# Patient Record
Sex: Female | Born: 1980 | Race: White | Hispanic: No | Marital: Married | Smoking: Never smoker
Health system: Southern US, Community
[De-identification: ages and names within clinical notes are randomized; demographics above are authoritative.]

## PROBLEM LIST (undated history)

## (undated) ENCOUNTER — Inpatient Hospital Stay (HOSPITAL_COMMUNITY): Payer: Self-pay

## (undated) DIAGNOSIS — Z789 Other specified health status: Secondary | ICD-10-CM

## (undated) HISTORY — PX: TONSILLECTOMY: SUR1361

## (undated) HISTORY — PX: BREAST SURGERY: SHX581

---
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
## 1999-10-17 ENCOUNTER — Inpatient Hospital Stay (HOSPITAL_COMMUNITY): Admission: AD | Admit: 1999-10-17 | Discharge: 1999-10-17 | Payer: Self-pay | Admitting: Obstetrics

## 1999-11-13 ENCOUNTER — Other Ambulatory Visit: Admission: RE | Admit: 1999-11-13 | Discharge: 1999-11-13 | Payer: Self-pay | Admitting: Gynecology

## 2000-03-09 ENCOUNTER — Inpatient Hospital Stay (HOSPITAL_COMMUNITY): Admission: AD | Admit: 2000-03-09 | Discharge: 2000-03-09 | Payer: Self-pay | Admitting: Obstetrics and Gynecology

## 2000-04-08 ENCOUNTER — Inpatient Hospital Stay (HOSPITAL_COMMUNITY): Admission: AD | Admit: 2000-04-08 | Discharge: 2000-04-08 | Payer: Self-pay | Admitting: Obstetrics and Gynecology

## 2000-05-03 ENCOUNTER — Inpatient Hospital Stay (HOSPITAL_COMMUNITY): Admission: AD | Admit: 2000-05-03 | Discharge: 2000-05-03 | Payer: Self-pay | Admitting: Obstetrics and Gynecology

## 2000-05-27 ENCOUNTER — Inpatient Hospital Stay (HOSPITAL_COMMUNITY): Admission: AD | Admit: 2000-05-27 | Discharge: 2000-05-27 | Payer: Self-pay | Admitting: Obstetrics and Gynecology

## 2000-06-03 ENCOUNTER — Inpatient Hospital Stay (HOSPITAL_COMMUNITY): Admission: AD | Admit: 2000-06-03 | Discharge: 2000-06-05 | Payer: Self-pay | Admitting: Obstetrics and Gynecology

## 2000-08-09 ENCOUNTER — Emergency Department (HOSPITAL_COMMUNITY): Admission: EM | Admit: 2000-08-09 | Discharge: 2000-08-09 | Payer: Self-pay | Admitting: Emergency Medicine

## 2002-01-20 ENCOUNTER — Emergency Department (HOSPITAL_COMMUNITY): Admission: EM | Admit: 2002-01-20 | Discharge: 2002-01-20 | Payer: Self-pay | Admitting: *Deleted

## 2002-01-20 ENCOUNTER — Encounter: Payer: Self-pay | Admitting: *Deleted

## 2002-10-27 ENCOUNTER — Emergency Department (HOSPITAL_COMMUNITY): Admission: EM | Admit: 2002-10-27 | Discharge: 2002-10-27 | Payer: Self-pay | Admitting: Emergency Medicine

## 2003-06-17 ENCOUNTER — Other Ambulatory Visit: Admission: RE | Admit: 2003-06-17 | Discharge: 2003-06-17 | Payer: Self-pay | Admitting: Obstetrics and Gynecology

## 2004-06-12 ENCOUNTER — Other Ambulatory Visit: Admission: RE | Admit: 2004-06-12 | Discharge: 2004-06-12 | Payer: Self-pay | Admitting: Obstetrics and Gynecology

## 2004-10-30 ENCOUNTER — Inpatient Hospital Stay (HOSPITAL_COMMUNITY): Admission: AD | Admit: 2004-10-30 | Discharge: 2004-10-30 | Payer: Self-pay | Admitting: Obstetrics and Gynecology

## 2005-02-21 ENCOUNTER — Other Ambulatory Visit: Admission: RE | Admit: 2005-02-21 | Discharge: 2005-02-21 | Payer: Self-pay | Admitting: Obstetrics and Gynecology

## 2007-03-23 ENCOUNTER — Emergency Department: Payer: Self-pay | Admitting: Emergency Medicine

## 2015-02-17 LAB — OB RESULTS CONSOLE RPR: RPR: NONREACTIVE

## 2015-02-17 LAB — OB RESULTS CONSOLE ABO/RH: RH Type: POSITIVE

## 2015-02-17 LAB — OB RESULTS CONSOLE HIV ANTIBODY (ROUTINE TESTING): HIV: NONREACTIVE

## 2015-02-17 LAB — OB RESULTS CONSOLE HEPATITIS B SURFACE ANTIGEN: Hepatitis B Surface Ag: NEGATIVE

## 2015-02-17 LAB — OB RESULTS CONSOLE RUBELLA ANTIBODY, IGM: Rubella: IMMUNE

## 2015-02-17 LAB — OB RESULTS CONSOLE ANTIBODY SCREEN: ANTIBODY SCREEN: NEGATIVE

## 2015-02-22 LAB — OB RESULTS CONSOLE GC/CHLAMYDIA
CHLAMYDIA, DNA PROBE: NEGATIVE
GC PROBE AMP, GENITAL: NEGATIVE

## 2015-03-18 ENCOUNTER — Encounter (HOSPITAL_COMMUNITY): Payer: Self-pay | Admitting: *Deleted

## 2015-03-18 ENCOUNTER — Inpatient Hospital Stay (HOSPITAL_COMMUNITY)
Admission: AD | Admit: 2015-03-18 | Discharge: 2015-03-18 | Disposition: A | Discharge status: Home / Self Care | Payer: Medicaid Other | Source: Ambulatory Visit | Attending: Obstetrics and Gynecology | Admitting: Obstetrics and Gynecology

## 2015-03-18 DIAGNOSIS — Z3A13 13 weeks gestation of pregnancy: Secondary | ICD-10-CM | POA: Insufficient documentation

## 2015-03-18 DIAGNOSIS — O26852 Spotting complicating pregnancy, second trimester: Secondary | ICD-10-CM

## 2015-03-18 DIAGNOSIS — O209 Hemorrhage in early pregnancy, unspecified: Secondary | ICD-10-CM | POA: Insufficient documentation

## 2015-03-18 HISTORY — DX: Other specified health status: Z78.9

## 2015-03-18 LAB — URINALYSIS, ROUTINE W REFLEX MICROSCOPIC
Bilirubin Urine: NEGATIVE
Glucose, UA: NEGATIVE mg/dL
Hgb urine dipstick: NEGATIVE
Ketones, ur: NEGATIVE mg/dL
Leukocytes, UA: NEGATIVE
Nitrite: NEGATIVE
Protein, ur: NEGATIVE mg/dL
UROBILINOGEN UA: 0.2 mg/dL (ref 0.0–1.0)
pH: 5 (ref 5.0–8.0)

## 2015-03-18 LAB — BASIC METABOLIC PANEL
Anion gap: 6 (ref 5–15)
BUN: 9 mg/dL (ref 6–20)
CHLORIDE: 105 mmol/L (ref 101–111)
CO2: 24 mmol/L (ref 22–32)
Calcium: 8.9 mg/dL (ref 8.9–10.3)
Creatinine, Ser: 0.52 mg/dL (ref 0.44–1.00)
GFR calc Af Amer: >60 mL/min (ref >60)
GFR calc non Af Amer: >60 mL/min (ref >60)
GLUCOSE: 80 mg/dL (ref 65–99)
POTASSIUM: 4 mmol/L (ref 3.5–5.1)
SODIUM: 135 mmol/L (ref 135–145)

## 2015-03-18 LAB — CBC
HEMATOCRIT: 34.7 % — AB (ref 36.0–46.0)
Hemoglobin: 11.5 g/dL — ABNORMAL LOW (ref 12.0–15.0)
MCH: 28.8 pg (ref 26.0–34.0)
MCHC: 33.1 g/dL (ref 30.0–36.0)
MCV: 86.8 fL (ref 78.0–100.0)
Platelets: 188 10*3/uL (ref 150–400)
RBC: 4 MIL/uL (ref 3.87–5.11)
RDW: 14.2 % (ref 11.5–15.5)
WBC: 9.2 10*3/uL (ref 4.0–10.5)

## 2015-03-18 NOTE — MAU Note (Signed)
Vaginal bleeding this am about the size of half dollar. States has not felt well past 24 hours. Cramping to lower abd., Nausea and vomiting thisa m

## 2015-03-18 NOTE — MAU Provider Note (Signed)
History     CSN: [id number]  Arrival date and time: 03/18/15 1334   First Provider Initiated Contact with Patient 03/18/15 1436      Chief Complaint  Patient presents with  . Vaginal Bleeding   HPI Comments: Sierra Bridges is a 34 y.o. [id number] at [redacted]w[redacted]d presented with onset this morning of first episode of vaginal spotting. She reports quarter size of brown pink blood noted several hours ago. No antecedent intercourse. No irritative vaginitis and has had negative cultures at her NOB visit recently. She also reports suprapubic cramping intermittently for a couple of days as well as pronounced fatigue and weakness for 2 days. Denies orthostatic symptoms at present.   Vaginal Bleeding The patient's primary symptoms include vaginal bleeding. The patient's pertinent negatives include no genital itching, genital lesions, genital odor or vaginal discharge. This is a new problem. The current episode started today. The problem has been unchanged. The pain is mild. The problem affects both sides. She is pregnant. Associated symptoms include abdominal pain and vomiting. Pertinent negatives include no back pain, chills, diarrhea, dysuria, fever, flank pain, frequency, hematuria, nausea or urgency. Associated symptoms comments: Pressures sensation with cramping that waxes and wanes. Single episode of vomiting this morning. Has retained food and fluids since.. The vaginal discharge was normal. The vaginal bleeding is spotting. She has not been passing clots. She has not been passing tissue. She has tried nothing for the symptoms. She is sexually active. No, her partner does not have an STD.  blood type: A pos  OB History  Gravida Para Term Preterm AB SAB TAB Ectopic Multiple Living  4 3 2 1      3     # Outcome Date GA Lbr Len/2nd Weight Sex Delivery Anes PTL Lv  4 Current           3 Preterm           2 Term           1 Term                Past Medical History  Diagnosis Date  . Medical  history non-contributory     Past Surgical History  Procedure Laterality Date  . Breast surgery    . Tonsillectomy      History reviewed. No pertinent family history.  Social History  Substance Use Topics  . Smoking status: Never Smoker   . Smokeless tobacco: None  . Alcohol Use: No    Allergies:  Allergies  Allergen Reactions  . Penicillins Rash    Has patient had a PCN reaction causing immediate rash, facial/tongue/throat swelling, SOB or lightheadedness with hypotension: Yes Has patient had a PCN reaction causing severe rash involving mucus membranes or skin necrosis: No Has patient had a PCN reaction that required hospitalization No Has patient had a PCN reaction occurring within the last 10 years: Yes If all of the above answers are "NO", then may proceed with Cephalosporin use.     Prescriptions prior to admission  Medication Sig Dispense Refill Last Dose  . Cholecalciferol (VITAMIN D3) 10000 UNITS capsule Take 10,000 Units by mouth once a week. mondays   Past Week at Unknown time  . Doxylamine-Pyridoxine (DICLEGIS) 10-10 MG TBEC Take 2 tablets by mouth at bedtime.   03/17/2015 at Unknown time  . omega-3 acid ethyl esters (LOVAZA) 1 G capsule Take 1 g by mouth daily.   03/18/2015 at Unknown time  . OVER THE COUNTER MEDICATION Take 1  capsule by mouth 2 (two) times daily as needed (for gi tract). Zypan, gi tract,   Past Week at Unknown time  . Prenatal Vit-Fe Fumarate-FA (PRENATAL MULTIVITAMIN) TABS tablet Take 1 tablet by mouth.   03/18/2015 at Unknown time    Review of Systems  Constitutional: Negative for fever and chills.  Gastrointestinal: Positive for vomiting and abdominal pain. Negative for nausea and diarrhea.  Genitourinary: Positive for vaginal bleeding. Negative for dysuria, urgency, frequency, hematuria, flank pain and vaginal discharge.  Musculoskeletal: Negative for back pain.   Physical Exam   Blood pressure 124/85, pulse 84, temperature 98.1 F (36.7 C),  temperature source Oral, resp. rate 20.  Physical Exam  Nursing note and vitals reviewed. Constitutional: She is oriented to person, place, and time. She appears well-developed and well-nourished.  HENT:  Head: Normocephalic.  Eyes: Pupils are equal, round, and reactive to light.  Neck: Normal range of motion.  Cardiovascular: Normal rate and regular rhythm.   Respiratory: Effort normal.  GI: Soft. There is no tenderness.  Genitourinary:  NEFG; vagina with scant physiologic discharge; cx clean, L/C/H,uterus 14-16wk size. FHR 146  Musculoskeletal: Normal range of motion.  Neurological: She is alert and oriented to person, place, and time.  Skin: Skin is warm and dry.  Psychiatric: She has a normal mood and affect.  anxious    MAU Course  Procedures Results for orders placed or performed during the hospital encounter of 03/18/15 (from the past 24 hour(s))  Urinalysis, Routine w reflex microscopic (not at [facility])     Status: Abnormal   Collection Time: 03/18/15  1:37 PM  Result Value Ref Range   Color, Urine YELLOW YELLOW   APPearance CLEAR CLEAR   Specific Gravity, Urine <1.005 (L) 1.005 - 1.030   pH 5.0 5.0 - 8.0   Glucose, UA NEGATIVE NEGATIVE mg/dL   Hgb urine dipstick NEGATIVE NEGATIVE   Bilirubin Urine NEGATIVE NEGATIVE   Ketones, ur NEGATIVE NEGATIVE mg/dL   Protein, ur NEGATIVE NEGATIVE mg/dL   Urobilinogen, UA 0.2 0.0 - 1.0 mg/dL   Nitrite NEGATIVE NEGATIVE   Leukocytes, UA NEGATIVE NEGATIVE  Basic metabolic panel     Status: None   Collection Time: 03/18/15  3:00 PM  Result Value Ref Range   Sodium 135 135 - 145 mmol/L   Potassium 4.0 3.5 - 5.1 mmol/L   Chloride 105 101 - 111 mmol/L   CO2 24 22 - 32 mmol/L   Glucose, Bld 80 65 - 99 mg/dL   BUN 9 6 - 20 mg/dL   Creatinine, Ser 6.96 0.44 - 1.00 mg/dL   Calcium 8.9 8.9 - 29.5 mg/dL   GFR calc non Af Amer >60 >60 mL/min   GFR calc Af Amer >60 >60 mL/min   Anion gap 6 5 - 15  CBC     Status: Abnormal   Collection  Time: 03/18/15  3:00 PM  Result Value Ref Range   WBC 9.2 4.0 - 10.5 K/uL   RBC 4.00 3.87 - 5.11 MIL/uL   Hemoglobin 11.5 (L) 12.0 - 15.0 g/dL   HCT 28.4 (L) 13.2 - 44.0 %   MCV 86.8 78.0 - 100.0 fL   MCH 28.8 26.0 - 34.0 pg   MCHC 33.1 30.0 - 36.0 g/dL   RDW 10.2 72.5 - 36.6 %   Platelets 188 150 - 400 K/uL   MDM No ative VB or cervicitis. Fatigue without evident hypoglycemia or anemia C/W Dr. Rana Snare  Assessment and Plan   1. Spotting  complicating pregnancy, second trimester    Discharge home with reassurance. Advised to eat small frequent meals, no sudden position changes, increase rest, increase fluids   Medication List    ASK your doctor about these medications        DICLEGIS 10-10 MG Tbec  Generic drug:  Doxylamine-Pyridoxine  Take 2 tablets by mouth at bedtime.     omega-3 acid ethyl esters 1 G capsule  Commonly known as:  LOVAZA  Take 1 g by mouth daily.     OVER THE COUNTER MEDICATION  Take 1 capsule by mouth 2 (two) times daily as needed (for gi tract). Zypan, gi tract,     prenatal multivitamin Tabs tablet  Take 1 tablet by mouth.     Vitamin D3 10000 UNITS capsule  Take 10,000 Units by mouth once a week. mondays       Follow-up Information    Follow up with LOWE,DAVID C, MD On 03/22/2015.   Specialty:  Obstetrics and Gynecology   Why:  Keep your scheduled prenatal appointment   Contact information:   [address] [place], [address] [place] [address] [phone]      Valeria Krisko 03/18/2015, 2:50 PM

## 2015-03-18 NOTE — Discharge Instructions (Signed)
Vaginal Bleeding During Pregnancy, Second Trimester °A small amount of bleeding (spotting) from the vagina is relatively common in pregnancy. It usually stops on its own. Various things can cause bleeding or spotting in pregnancy. Some bleeding may be related to the pregnancy, and some may not. Sometimes the bleeding is normal and is not a problem. However, bleeding can also be a sign of something serious. Be sure to tell your health care provider about any vaginal bleeding right away. °Some possible causes of vaginal bleeding during the second trimester include: °· Infection, inflammation, or growths on the cervix.   °· The placenta may be partially or completely covering the opening of the cervix inside the uterus (placenta previa). °· The placenta may have separated from the uterus (abruption of the placenta).   °· You may be having early (preterm) labor.   °· The cervix may not be strong enough to keep a baby inside the uterus (cervical insufficiency).   °· Tiny cysts may have developed in the uterus instead of pregnancy tissue (molar pregnancy).  °HOME CARE INSTRUCTIONS  °Watch your condition for any changes. The following actions may help to lessen any discomfort you are feeling: °· Follow your health care provider's instructions for limiting your activity. If your health care provider orders bed rest, you may need to stay in bed and only get up to use the bathroom. However, your health care provider may allow you to continue light activity. °· If needed, make plans for someone to help with your regular activities and responsibilities while you are on bed rest. °· Keep track of the number of pads you use each day, how often you change pads, and how soaked (saturated) they are. Write this down. °· Do not use tampons. Do not douche. °· Do not have sexual intercourse or orgasms until approved by your health care provider. °· If you pass any tissue from your vagina, save the tissue so you can show it to your  health care provider. °· Only take over-the-counter or prescription medicines as directed by your health care provider. °· Do not take aspirin because it can make you bleed. °· Do not exercise or perform any strenuous activities or heavy lifting without your health care provider's permission. °· Keep all follow-up appointments as directed by your health care provider. °SEEK MEDICAL CARE IF: °· You have any vaginal bleeding during any part of your pregnancy. °· You have cramps or labor pains. °· You have a fever, not controlled by medicine. °SEEK IMMEDIATE MEDICAL CARE IF:  °· You have severe cramps in your back or belly (abdomen). °· You have contractions. °· You have chills. °· You pass large clots or tissue from your vagina. °· Your bleeding increases. °· You feel light-headed or weak, or you have fainting episodes. °· You are leaking fluid or have a gush of fluid from your vagina. °MAKE SURE YOU: °· Understand these instructions. °· Will watch your condition. °· Will get help right away if you are not doing well or get worse. °Document Released: 04/04/2005 Document Revised: 06/30/2013 Document Reviewed: 03/02/2013 °Exit[employer]® Patient Information ©2015 [employer]. This information is not intended to replace advice given to you by your health care provider. Make sure you discuss any questions you have with your health care provider. ° °

## 2015-03-22 ENCOUNTER — Encounter (HOSPITAL_COMMUNITY): Payer: Self-pay

## 2015-07-10 NOTE — L&D Delivery Note (Signed)
Delivery Note At 10:19 PM a viable female was delivered via Vaginal, Spontaneous Delivery Apgars 9 9 weight pending   Placenta status: spontaneously with 3 vessel cord, .  Cord:  with the following complications:long .  Cord pH: not obtained  Anesthesia:  epidural Episiotomy:  none Lacerations: none  Suture Repair: not applicable Est. Blood Loss (mL):  300  Mom to postpartum.  Baby to Couplet care / Skin to Skin.  Antonetta Clanton L 08/31/2015, 10:28 PM

## 2015-08-15 ENCOUNTER — Inpatient Hospital Stay (HOSPITAL_COMMUNITY)
Admission: AD | Admit: 2015-08-15 | Discharge: 2015-08-16 | Disposition: A | Discharge status: Home / Self Care | Payer: Medicaid Other | Source: Ambulatory Visit | Attending: Obstetrics & Gynecology | Admitting: Obstetrics & Gynecology

## 2015-08-15 ENCOUNTER — Encounter (HOSPITAL_COMMUNITY): Payer: Self-pay

## 2015-08-15 DIAGNOSIS — Z3A36 36 weeks gestation of pregnancy: Secondary | ICD-10-CM | POA: Diagnosis not present

## 2015-08-15 LAB — URINALYSIS, ROUTINE W REFLEX MICROSCOPIC
BILIRUBIN URINE: NEGATIVE
Glucose, UA: NEGATIVE mg/dL
Hgb urine dipstick: NEGATIVE
Ketones, ur: NEGATIVE mg/dL
Leukocytes, UA: NEGATIVE
NITRITE: NEGATIVE
Protein, ur: NEGATIVE mg/dL
pH: 6.5 (ref 5.0–8.0)

## 2015-08-15 NOTE — MAU Note (Signed)
Pt c/o contractions that are every 5 mins that started tonight. Has felt nauseous all day and like she was about to start period. Denies vag bleeding or LOF. +FM. Baby is breech. Cervix was 1.5 last week in office.

## 2015-08-15 NOTE — MAU Note (Signed)
Called Dr. Langston Masker and she offered pt a procardia for the contractions.  She decided against it. Will recheck cervix in one hour.

## 2015-08-16 NOTE — Discharge Instructions (Signed)
Hypertension During Pregnancy °Hypertension, or high blood pressure, is when there is extra pressure inside your blood vessels that carry blood from the heart to the rest of your body (arteries). It can happen at any time in life, including pregnancy. Hypertension during pregnancy can cause problems for you and your baby. Your baby might not weigh as much as he or she should at birth or might be born early (premature). Very bad cases of hypertension during pregnancy can be life-threatening.  °Different types of hypertension can occur during pregnancy. These include: °· Chronic hypertension. This happens when a woman has hypertension before pregnancy and it continues during pregnancy. °· Gestational hypertension. This is when hypertension develops during pregnancy. °· Preeclampsia or toxemia of pregnancy. This is a very serious type of hypertension that develops only during pregnancy. It affects the whole body and can be very dangerous for both mother and baby.   °Gestational hypertension and preeclampsia usually go away after your baby is born. Your blood pressure will likely stabilize within 6 weeks. Women who have hypertension during pregnancy have a greater chance of developing hypertension later in life or with future pregnancies. °RISK FACTORS °There are certain factors that make it more likely for you to develop hypertension during pregnancy. These include: °· Having hypertension before pregnancy. °· Having hypertension during a previous pregnancy. °· Being overweight. °· Being older than 40 years. °· Being pregnant with more than one baby. °· Having diabetes or kidney problems. °SIGNS AND SYMPTOMS °Chronic and gestational hypertension rarely cause symptoms. Preeclampsia has symptoms, which may include: °· Increased protein in your urine. Your health care provider will check for this at every prenatal visit. °· Swelling of your hands and face. °· Rapid weight gain. °· Headaches. °· Visual changes. °· Being  bothered by light. °· Abdominal pain, especially in the upper right area. °· Chest pain. °· Shortness of breath. °· Increased reflexes. °· Seizures. These occur with a more severe form of preeclampsia, called eclampsia. °DIAGNOSIS  °You may be diagnosed with hypertension during a regular prenatal exam. At each prenatal visit, you may have: °· Your blood pressure checked. °· A urine test to check for protein in your urine. °The type of hypertension you are diagnosed with depends on when you developed it. It also depends on your specific blood pressure reading. °· Developing hypertension before 20 weeks of pregnancy is consistent with chronic hypertension. °· Developing hypertension after 20 weeks of pregnancy is consistent with gestational hypertension. °· Hypertension with increased urinary protein is diagnosed as preeclampsia. °· Blood pressure measurements that stay above 160 systolic or 110 diastolic are a sign of severe preeclampsia. °TREATMENT °Treatment for hypertension during pregnancy varies. Treatment depends on the type of hypertension and how serious it is. °· If you take medicine for chronic hypertension, you may need to switch medicines. °¨ Medicines called ACE inhibitors should not be taken during pregnancy. °¨ Low-dose aspirin may be suggested for women who have risk factors for preeclampsia. °· If you have gestational hypertension, you may need to take a blood pressure medicine that is safe during pregnancy. Your health care provider will recommend the correct medicine. °· If you have severe preeclampsia, you may need to be in the hospital. Health care providers will watch you and your baby very closely. You also may need to take medicine called magnesium sulfate to prevent seizures and lower blood pressure. °· Sometimes, an early delivery is needed. This may be the case if the condition worsens. It would be   done to protect you and your baby. The only cure for preeclampsia is delivery. °· Your health  care provider may recommend that you take one low-dose aspirin (81 mg) each day to help prevent high blood pressure during your pregnancy if you are at risk for preeclampsia. You may be at risk for preeclampsia if: °¨ You had preeclampsia or eclampsia during a previous pregnancy. °¨ Your baby did not grow as expected during a previous pregnancy. °¨ You experienced preterm birth with a previous pregnancy. °¨ You experienced a separation of the placenta from the uterus (placental abruption) during a previous pregnancy. °¨ You experienced the loss of your baby during a previous pregnancy. °¨ You are pregnant with more than one baby. °¨ You have other medical conditions, such as diabetes or an autoimmune disease. °HOME CARE INSTRUCTIONS °· Schedule and keep all of your regular prenatal care appointments. This is important. °· Take medicines only as directed by your health care provider. Tell your health care provider about all medicines you take. °· Eat as little salt as possible. °· Get regular exercise. °· Do not drink alcohol. °· Do not use tobacco products. °· Do not drink products with caffeine. °· Lie on your left side when resting. °SEEK IMMEDIATE MEDICAL CARE IF: °· You have severe abdominal pain. °· You have sudden swelling in your hands, ankles, or face. °· You gain 4 pounds (1.8 kg) or more in 1 week. °· You vomit repeatedly. °· You have vaginal bleeding. °· You do not feel your baby moving as much. °· You have a headache. °· You have blurred or double vision. °· You have muscle twitching or spasms. °· You have shortness of breath. °· You have blue fingernails or lips. °· You have blood in your urine. °MAKE SURE YOU: °· Understand these instructions. °· Will watch your condition. °· Will get help right away if you are not doing well or get worse. °  °This information is not intended to replace advice given to you by your health care provider. Make sure you discuss any questions you have with your health care  provider. °  °Document Released: 03/13/2011 Document Revised: 07/16/2014 Document Reviewed: 01/22/2013 °Elsevier Interactive Patient Education ©2016 [employer] °Braxton Hicks Contractions °Contractions of the uterus can occur throughout pregnancy. Contractions are not always a sign that you are in labor.  °WHAT ARE BRAXTON HICKS CONTRACTIONS?  °Contractions that occur before labor are called Braxton Hicks contractions, or false labor. Toward the end of pregnancy (32-34 weeks), these contractions can develop more often and may become more forceful. This is not true labor because these contractions do not result in opening (dilatation) and thinning of the cervix. They are sometimes difficult to tell apart from true labor because these contractions can be forceful and people have different pain tolerances. You should not feel embarrassed if you go to the hospital with false labor. Sometimes, the only way to tell if you are in true labor is for your health care provider to look for changes in the cervix. °If there are no prenatal problems or other health problems associated with the pregnancy, it is completely safe to be sent home with false labor and await the onset of true labor. °HOW CAN YOU TELL THE DIFFERENCE BETWEEN TRUE AND FALSE LABOR? °False Labor °· The contractions of false labor are usually shorter and not as hard as those of true labor.   °· The contractions are usually irregular.   °· The contractions are often felt in   the front of the lower abdomen and in the groin.   °· The contractions may go away when you walk around or change positions while lying down.   °· The contractions get weaker and are shorter lasting as time goes on.   °· The contractions do not usually become progressively stronger, regular, and closer together as with true labor.   °True Labor °· Contractions in true labor last 30-70 seconds, become very regular, usually become more intense, and increase in frequency.   °· The  contractions do not go away with walking.   °· The discomfort is usually felt in the top of the uterus and spreads to the lower abdomen and low back.   °· True labor can be determined by your health care provider with an exam. This will show that the cervix is dilating and getting thinner.   °WHAT TO REMEMBER °· Keep up with your usual exercises and follow other instructions given by your health care provider.   °· Take medicines as directed by your health care provider.   °· Keep your regular prenatal appointments.   °· Eat and drink lightly if you think you are going into labor.   °· If Braxton Hicks contractions are making you uncomfortable:   °¨ Change your position from lying down or resting to walking, or from walking to resting.   °¨ Sit and rest in a tub of warm water.   °¨ Drink 2-3 glasses of water. Dehydration may cause these contractions.   °¨ Do slow and deep breathing several times an hour.   °WHEN SHOULD I SEEK IMMEDIATE MEDICAL CARE? °Seek immediate medical care if: °· Your contractions become stronger, more regular, and closer together.   °· You have fluid leaking or gushing from your vagina.   °· You have a fever.   °· You pass blood-tinged mucus.   °· You have vaginal bleeding.   °· You have continuous abdominal pain.   °· You have low back pain that you never had before.   °· You feel your baby's head pushing down and causing pelvic pressure.   °· Your baby is not moving as much as it used to.   °  °This information is not intended to replace advice given to you by your health care provider. Make sure you discuss any questions you have with your health care provider. °  °Document Released: 06/25/2005 Document Revised: 06/30/2013 Document Reviewed: 04/06/2013 °Elsevier Interactive Patient Education ©2016 [employer] ° °

## 2015-08-17 LAB — OB RESULTS CONSOLE GBS: GBS: NEGATIVE

## 2015-08-29 ENCOUNTER — Encounter (HOSPITAL_COMMUNITY): Payer: Self-pay | Admitting: *Deleted

## 2015-08-29 ENCOUNTER — Inpatient Hospital Stay (HOSPITAL_COMMUNITY)
Admission: AD | Admit: 2015-08-29 | Discharge: 2015-08-30 | DRG: 778 | Disposition: A | Discharge status: Home / Self Care | Payer: Medicaid Other | Source: Ambulatory Visit | Attending: Obstetrics and Gynecology | Admitting: Obstetrics and Gynecology

## 2015-08-29 DIAGNOSIS — O26893 Other specified pregnancy related conditions, third trimester: Secondary | ICD-10-CM | POA: Diagnosis present

## 2015-08-29 DIAGNOSIS — Z3A37 37 weeks gestation of pregnancy: Secondary | ICD-10-CM

## 2015-08-29 DIAGNOSIS — R030 Elevated blood-pressure reading, without diagnosis of hypertension: Secondary | ICD-10-CM | POA: Diagnosis present

## 2015-08-29 DIAGNOSIS — IMO0001 Reserved for inherently not codable concepts without codable children: Secondary | ICD-10-CM

## 2015-08-29 LAB — CBC WITH DIFFERENTIAL/PLATELET
BASOS PCT: 0 %
Basophils Absolute: 0 10*3/uL (ref 0.0–0.1)
EOS PCT: 1 %
Eosinophils Absolute: 0.1 10*3/uL (ref 0.0–0.7)
HEMATOCRIT: 32 % — AB (ref 36.0–46.0)
Hemoglobin: 10.7 g/dL — ABNORMAL LOW (ref 12.0–15.0)
Lymphocytes Relative: 18 %
Lymphs Abs: 2.3 10*3/uL (ref 0.7–4.0)
MCH: 29.2 pg (ref 26.0–34.0)
MCHC: 33.4 g/dL (ref 30.0–36.0)
MCV: 87.2 fL (ref 78.0–100.0)
MONO ABS: 0.4 10*3/uL (ref 0.1–1.0)
MONOS PCT: 3 %
NEUTROS ABS: 10 10*3/uL — AB (ref 1.7–7.7)
Neutrophils Relative %: 78 %
PLATELETS: 235 10*3/uL (ref 150–400)
RBC: 3.67 MIL/uL — ABNORMAL LOW (ref 3.87–5.11)
RDW: 13.8 % (ref 11.5–15.5)
WBC: 12.7 10*3/uL — ABNORMAL HIGH (ref 4.0–10.5)

## 2015-08-29 LAB — URINE MICROSCOPIC-ADD ON: RBC / HPF: NONE SEEN RBC/hpf (ref 0–5)

## 2015-08-29 LAB — COMPREHENSIVE METABOLIC PANEL
ALBUMIN: 2.9 g/dL — AB (ref 3.5–5.0)
ALT: 14 U/L (ref 14–54)
ANION GAP: 11 (ref 5–15)
AST: 26 U/L (ref 15–41)
Alkaline Phosphatase: 128 U/L — ABNORMAL HIGH (ref 38–126)
BILIRUBIN TOTAL: 0.7 mg/dL (ref 0.3–1.2)
BUN: <5 mg/dL — ABNORMAL LOW (ref 6–20)
CHLORIDE: 105 mmol/L (ref 101–111)
CO2: 21 mmol/L — ABNORMAL LOW (ref 22–32)
Calcium: 9.4 mg/dL (ref 8.9–10.3)
Creatinine, Ser: 0.72 mg/dL (ref 0.44–1.00)
GFR calc Af Amer: >60 mL/min (ref >60)
Glucose, Bld: 126 mg/dL — ABNORMAL HIGH (ref 65–99)
POTASSIUM: 3.6 mmol/L (ref 3.5–5.1)
Sodium: 137 mmol/L (ref 135–145)
TOTAL PROTEIN: 6.7 g/dL (ref 6.5–8.1)

## 2015-08-29 LAB — URINALYSIS, ROUTINE W REFLEX MICROSCOPIC
Bilirubin Urine: NEGATIVE
Glucose, UA: NEGATIVE mg/dL
Ketones, ur: NEGATIVE mg/dL
Leukocytes, UA: NEGATIVE
Nitrite: NEGATIVE
PH: 5.5 (ref 5.0–8.0)
Protein, ur: NEGATIVE mg/dL

## 2015-08-29 LAB — PROTEIN / CREATININE RATIO, URINE
CREATININE, URINE: 22 mg/dL
Total Protein, Urine: <6 mg/dL

## 2015-08-29 LAB — URIC ACID: URIC ACID, SERUM: 4.7 mg/dL (ref 2.3–6.6)

## 2015-08-29 LAB — LACTATE DEHYDROGENASE: LDH: 138 U/L (ref 98–192)

## 2015-08-29 MED ORDER — ONDANSETRON 8 MG PO TBDP
8.0000 mg | ORAL_TABLET | Freq: Once | ORAL | Status: AC
  Administered 2015-08-29: 8 mg via ORAL
  Filled 2015-08-29: qty 1

## 2015-08-29 NOTE — MAU Provider Note (Signed)
History     CSN: [id number]  Arrival date and time: 08/29/15 2102   First Provider Initiated Contact with Patient 08/29/15 2143      Chief Complaint  Patient presents with  . Contractions   HPI  [name] is a 35 y.o. [phone] at [redacted]w[redacted]d who presents to MAU today with complaint of possible labor. The patient denies a history of HTN with this or previous pregnancies. She denies headache, blurred vision, or RUQ abdominal pain. She does endorse occasional floaters and peripheral edema. She states frequent, although irregular contractions. She noted spotting yesterday and bloody show today. She denies LOF. She reports normal fetal movement.   OB History    Gravida Para Term Preterm AB TAB SAB Ectopic Multiple Living   Past Medical History  Diagnosis Date  . Medical history non-contributory     Past Surgical History  Procedure Laterality Date  . Breast surgery    . Tonsillectomy      History reviewed. No pertinent family history.  Social History  Substance Use Topics  . Smoking status: Never Smoker   . Smokeless tobacco: None  . Alcohol Use: No    Allergies:  Allergies  Allergen Reactions  . Penicillins Rash    Has patient had a PCN reaction causing immediate rash, facial/tongue/throat swelling, SOB or lightheadedness with hypotension: Yes Has patient had a PCN reaction causing severe rash involving mucus membranes or skin necrosis: No Has patient had a PCN reaction that required hospitalization No Has patient had a PCN reaction occurring within the last 10 years: Yes If all of the above answers are "NO", then may proceed with Cephalosporin use.     Prescriptions prior to admission  Medication Sig Dispense Refill Last Dose  . Cholecalciferol (VITAMIN D3) 10000 UNITS capsule Take 10,000 Units by mouth once a week. mondays   Past Week at Unknown time  . Prenatal Vit-Fe Fumarate-FA (PRENATAL MULTIVITAMIN) TABS tablet Take 1 tablet by mouth  daily at 12 noon.    08/28/2015 at Unknown time    Review of Systems  Constitutional: Negative for fever and malaise/fatigue.  Eyes: Negative for blurred vision.       + floaters  Cardiovascular: Positive for leg swelling.  Gastrointestinal: Negative for abdominal pain.  Genitourinary:       + bloody show Neg - LOF  Neurological: Negative for headaches.   Physical Exam   Blood pressure 128/78, pulse 91, temperature 98.4 F (36.9 C), temperature source Oral, resp. rate 16, SpO2 99 %, unknown if currently breastfeeding.  Physical Exam  Nursing note and vitals reviewed. Constitutional: She is oriented to person, place, and time. She appears well-developed and well-nourished. No distress.  HENT:  Head: Normocephalic and atraumatic.  Cardiovascular: Normal rate.   Respiratory: Effort normal.  GI: Soft. She exhibits no distension and no mass. There is tenderness (mild tenderness to palpation of the RUQ). There is no rebound and no guarding.  Musculoskeletal: She exhibits no edema.  Neurological: She is alert and oriented to person, place, and time. She has normal reflexes.  No clonus  Skin: Skin is warm and dry. No erythema.  Psychiatric: She has a normal mood and affect.  Dilation: 3 Effacement (%): 70 Cervical Position: Middle Station: -2 Presentation: Vertex Exam by:: Domenic Polite   Results for orders placed or performed during the hospital encounter of 08/29/15 (from the past 24 hour(s))  Protein /  creatinine ratio, urine     Status: None   Collection Time: 08/29/15  9:49 PM  Result Value Ref Range   Creatinine, Urine 22.00 mg/dL   Total Protein, Urine <6 mg/dL   Protein Creatinine Ratio        0.00 - 0.15 mg/mg[Cre]  Urinalysis, Routine w reflex microscopic (not at [facility])     Status: Abnormal   Collection Time: 08/29/15  9:49 PM  Result Value Ref Range   Color, Urine YELLOW YELLOW   APPearance CLEAR CLEAR   Specific Gravity, Urine <1.005 (L) 1.005 - 1.030   pH 5.5 5.0  - 8.0   Glucose, UA NEGATIVE NEGATIVE mg/dL   Hgb urine dipstick TRACE (A) NEGATIVE   Bilirubin Urine NEGATIVE NEGATIVE   Ketones, ur NEGATIVE NEGATIVE mg/dL   Protein, ur NEGATIVE NEGATIVE mg/dL   Nitrite NEGATIVE NEGATIVE   Leukocytes, UA NEGATIVE NEGATIVE  Urine microscopic-add on     Status: Abnormal   Collection Time: 08/29/15  9:49 PM  Result Value Ref Range   Squamous Epithelial / LPF 0-5 (A) NONE SEEN   WBC, UA 0-5 0 - 5 WBC/hpf   RBC / HPF NONE SEEN 0 - 5 RBC/hpf   Bacteria, UA RARE (A) NONE SEEN  CBC with Differential/Platelet     Status: Abnormal   Collection Time: 08/29/15 10:01 PM  Result Value Ref Range   WBC 12.7 (H) 4.0 - 10.5 K/uL   RBC 3.67 (L) 3.87 - 5.11 MIL/uL   Hemoglobin 10.7 (L) 12.0 - 15.0 g/dL   HCT 16.1 (L) 09.6 - 04.5 %   MCV 87.2 78.0 - 100.0 fL   MCH 29.2 26.0 - 34.0 pg   MCHC 33.4 30.0 - 36.0 g/dL   RDW 40.9 81.1 - 91.4 %   Platelets 235 150 - 400 K/uL   Neutrophils Relative % 78 %   Neutro Abs 10.0 (H) 1.7 - 7.7 K/uL   Lymphocytes Relative 18 %   Lymphs Abs 2.3 0.7 - 4.0 K/uL   Monocytes Relative 3 %   Monocytes Absolute 0.4 0.1 - 1.0 K/uL   Eosinophils Relative 1 %   Eosinophils Absolute 0.1 0.0 - 0.7 K/uL   Basophils Relative 0 %   Basophils Absolute 0.0 0.0 - 0.1 K/uL  Comprehensive metabolic panel     Status: Abnormal   Collection Time: 08/29/15 10:01 PM  Result Value Ref Range   Sodium 137 135 - 145 mmol/L   Potassium 3.6 3.5 - 5.1 mmol/L   Chloride 105 101 - 111 mmol/L   CO2 21 (L) 22 - 32 mmol/L   Glucose, Bld 126 (H) 65 - 99 mg/dL   BUN <5 (L) 6 - 20 mg/dL   Creatinine, Ser 7.82 0.44 - 1.00 mg/dL   Calcium 9.4 8.9 - 95.6 mg/dL   Total Protein 6.7 6.5 - 8.1 g/dL   Albumin 2.9 (L) 3.5 - 5.0 g/dL   AST 26 15 - 41 U/L   ALT 14 14 - 54 U/L   Alkaline Phosphatase 128 (H) 38 - 126 U/L   Total Bilirubin 0.7 0.3 - 1.2 mg/dL   GFR calc non Af Amer >60 >60 mL/min   GFR calc Af Amer >60 >60 mL/min   Anion gap 11 5 - 15  Uric acid      Status: None   Collection Time: 08/29/15 10:01 PM  Result Value Ref Range   Uric Acid, Serum 4.7 2.3 - 6.6 mg/dL  Lactate dehydrogenase  Status: None   Collection Time: 08/29/15 10:01 PM  Result Value Ref Range   LDH 138 98 - 192 U/L    Patient Vitals for the past 24 hrs:  BP Temp Temp src Pulse Resp SpO2  08/30/15 0100 128/78 mmHg - - 91 - -  08/29/15 2315 153/92 mmHg - - - - -  08/29/15 2312 147/91 mmHg 98.4 F (36.9 C) - 108 - -  08/29/15 2215 153/94 mmHg - - - - -  08/29/15 2206 150/97 mmHg - - 102 16 -  08/29/15 2145 146/96 mmHg - - 106 - 99 %  08/29/15 2130 150/96 mmHg - - 105 - 100 %  08/29/15 2122 150/96 mmHg - - 104 - -  08/29/15 2120 (!) 145/106 mmHg - - 106 - 100 %  08/29/15 2118 (!) 145/106 mmHg 98.5 F (36.9 C) Oral 99 20 100 %    Fetal Monitoring: Baseline: 125 bpm Variability: moderate Accelerations: 15 x 15 Decelerations: none Contractions: q 3-7 minutes  MAU Course  Procedures None  MDM Serial BPs UA, CBC, CMP, Uric Acid, LDH and urine protein/creatinine ratio today Discussed patient with Dr. Renaldo Fiddler. She recommends discharge at this time with follow-up in the office in the morning for repeat BP check.  Patient is requesting medication for nausea. 8 mg ODT Zofran given prior to discharge.  Patient has asked RN if she can stay longer for labor eval since she lives > 40 minutes away. RN will call Dr. Renaldo Fiddler for further instructions.  Assessment and Plan  A: SIUP at [redacted]w[redacted]d Uterine contractions Elevated blood pressure in pregnancy Nausea without vomiting  P: Admit to L&D for active labor  Marny Lowenstein, PA-C  08/30/2015, 1:26 AM

## 2015-08-29 NOTE — MAU Note (Signed)
Pt reports bloody show yesterday and today, tonight began having a lot of vaginal pressure and contractions.

## 2015-08-30 ENCOUNTER — Encounter (HOSPITAL_COMMUNITY): Payer: Self-pay

## 2015-08-30 DIAGNOSIS — R030 Elevated blood-pressure reading, without diagnosis of hypertension: Secondary | ICD-10-CM | POA: Diagnosis present

## 2015-08-30 DIAGNOSIS — O26893 Other specified pregnancy related conditions, third trimester: Secondary | ICD-10-CM | POA: Diagnosis present

## 2015-08-30 DIAGNOSIS — Z3A37 37 weeks gestation of pregnancy: Secondary | ICD-10-CM | POA: Diagnosis not present

## 2015-08-30 DIAGNOSIS — IMO0001 Reserved for inherently not codable concepts without codable children: Secondary | ICD-10-CM

## 2015-08-30 LAB — ABO/RH: ABO/RH(D): A POS

## 2015-08-30 MED ORDER — OXYTOCIN BOLUS FROM INFUSION: 500.0000 mL | INTRAVENOUS | Status: DC

## 2015-08-30 MED ORDER — CITRIC ACID-SODIUM CITRATE 334-500 MG/5ML PO SOLN: 30.0000 mL | ORAL | Status: DC | PRN

## 2015-08-30 MED ORDER — LACTATED RINGERS IV SOLN: 2.5000 [IU]/h | INTRAVENOUS | Status: DC

## 2015-08-30 MED ORDER — LACTATED RINGERS IV SOLN: 500.0000 mL | INTRAVENOUS | Status: DC | PRN

## 2015-08-30 MED ORDER — ZOLPIDEM TARTRATE 5 MG PO TABS
5.0000 mg | ORAL_TABLET | Freq: Once | ORAL | Status: AC
  Administered 2015-08-30: 5 mg via ORAL
  Filled 2015-08-30: qty 1

## 2015-08-30 MED ORDER — LIDOCAINE HCL (PF) 1 % IJ SOLN: 30.0000 mL | INTRAMUSCULAR | Status: DC | PRN

## 2015-08-30 MED ORDER — PROMETHAZINE HCL 25 MG/ML IJ SOLN
12.5000 mg | Freq: Once | INTRAMUSCULAR | Status: AC
  Administered 2015-08-30: 12.5 mg via INTRAVENOUS
  Filled 2015-08-30: qty 1

## 2015-08-30 MED ORDER — ONDANSETRON HCL 4 MG/2ML IJ SOLN: 4.0000 mg | Freq: Four times a day (QID) | INTRAMUSCULAR | Status: DC | PRN

## 2015-08-30 MED ORDER — OXYCODONE-ACETAMINOPHEN 5-325 MG PO TABS: 1.0000 | ORAL_TABLET | ORAL | Status: DC | PRN

## 2015-08-30 MED ORDER — FLEET ENEMA 7-19 GM/118ML RE ENEM: 1.0000 | ENEMA | RECTAL | Status: DC | PRN

## 2015-08-30 MED ORDER — BUTORPHANOL TARTRATE 1 MG/ML IJ SOLN
1.0000 mg | INTRAMUSCULAR | Status: DC | PRN
  Administered 2015-08-30 (×2): 1 mg via INTRAVENOUS
  Filled 2015-08-30 (×2): qty 1

## 2015-08-30 MED ORDER — OXYCODONE-ACETAMINOPHEN 5-325 MG PO TABS: 2.0000 | ORAL_TABLET | ORAL | Status: DC | PRN

## 2015-08-30 MED ORDER — ACETAMINOPHEN 325 MG PO TABS
650.0000 mg | ORAL_TABLET | ORAL | Status: DC | PRN
  Administered 2015-08-30: 650 mg via ORAL
  Filled 2015-08-30: qty 2

## 2015-08-30 MED ORDER — LACTATED RINGERS IV SOLN
INTRAVENOUS | Status: DC
  Administered 2015-08-30 (×3): via INTRAVENOUS

## 2015-08-30 NOTE — Progress Notes (Signed)
[redacted]w[redacted]d  In L and D for obsv. R/O labor, cx>> 1-2/long  Will recheck in 2-3 hrs and see if any change

## 2015-08-30 NOTE — Consults (Signed)
  Anesthesia Pain Consult Note  Patient: Sierra Bridges, 35 y.o., female  Consult Requested by: Zelphia Cairo, MD  Reason for Consult: CRNA pain rounds  Level of Consciousness: alert  Pain: 5 /10     Pain goal 8 with natural childbirth

## 2015-08-30 NOTE — Progress Notes (Addendum)
Sierra Overlie, MD notified of continued pain control issues. Contractions 5-8 minutes apart. Cervical Exam 2/20/-3 indicating no change since admission. MD ordered to discuss discharge home with Ambien to help her relax/rest. Pt and husband not comfortable with going home due to her amount of pressure and she is tearful stating she is scared of having this baby in the car since they live 45 min. Away. Pt states that she would feel the need to return to MAU due to the amount of pressure she is currently feeling. [place] notified of conversation and states to offer Ambien  and recheck cervix at 1530. If no change pt will be discharged home. *Pharmacy contacted and stated pt only allowed to  of Ambien per protocol. Order dose changed.

## 2015-08-31 ENCOUNTER — Inpatient Hospital Stay (HOSPITAL_COMMUNITY)
Admission: AD | Admit: 2015-08-31 | Discharge: 2015-09-02 | DRG: 775 | Disposition: A | Discharge status: Home / Self Care | Payer: Medicaid Other | Source: Ambulatory Visit | Attending: Obstetrics and Gynecology | Admitting: Obstetrics and Gynecology

## 2015-08-31 ENCOUNTER — Inpatient Hospital Stay (HOSPITAL_COMMUNITY): Payer: Medicaid Other | Admitting: Anesthesiology

## 2015-08-31 ENCOUNTER — Encounter (HOSPITAL_COMMUNITY): Payer: Self-pay | Admitting: *Deleted

## 2015-08-31 DIAGNOSIS — O134 Gestational [pregnancy-induced] hypertension without significant proteinuria, complicating childbirth: Principal | ICD-10-CM | POA: Diagnosis present

## 2015-08-31 DIAGNOSIS — Z3A37 37 weeks gestation of pregnancy: Secondary | ICD-10-CM

## 2015-08-31 DIAGNOSIS — O133 Gestational [pregnancy-induced] hypertension without significant proteinuria, third trimester: Secondary | ICD-10-CM

## 2015-08-31 DIAGNOSIS — IMO0001 Reserved for inherently not codable concepts without codable children: Secondary | ICD-10-CM

## 2015-08-31 LAB — TYPE AND SCREEN
ABO/RH(D): A POS
ABO/RH(D): A POS
Antibody Screen: NEGATIVE
Antibody Screen: NEGATIVE

## 2015-08-31 LAB — URINALYSIS, ROUTINE W REFLEX MICROSCOPIC
Bilirubin Urine: NEGATIVE
GLUCOSE, UA: NEGATIVE mg/dL
Ketones, ur: NEGATIVE mg/dL
LEUKOCYTES UA: NEGATIVE
NITRITE: NEGATIVE
PH: 6 (ref 5.0–8.0)
PROTEIN: NEGATIVE mg/dL
Specific Gravity, Urine: 1.01 (ref 1.005–1.030)

## 2015-08-31 LAB — CBC
HEMATOCRIT: 30.3 % — AB (ref 36.0–46.0)
HEMOGLOBIN: 10 g/dL — AB (ref 12.0–15.0)
MCH: 28.7 pg (ref 26.0–34.0)
MCHC: 33 g/dL (ref 30.0–36.0)
MCV: 87.1 fL (ref 78.0–100.0)
Platelets: 247 10*3/uL (ref 150–400)
RBC: 3.48 MIL/uL — ABNORMAL LOW (ref 3.87–5.11)
RDW: 14 % (ref 11.5–15.5)
WBC: 13.3 10*3/uL — ABNORMAL HIGH (ref 4.0–10.5)

## 2015-08-31 LAB — RPR: RPR: NONREACTIVE

## 2015-08-31 LAB — URINE MICROSCOPIC-ADD ON

## 2015-08-31 MED ORDER — PHENYLEPHRINE 40 MCG/ML (10ML) SYRINGE FOR IV PUSH (FOR BLOOD PRESSURE SUPPORT)
80.0000 ug | PREFILLED_SYRINGE | INTRAVENOUS | Status: DC | PRN
  Administered 2015-08-31 (×2): 80 ug via INTRAVENOUS
  Filled 2015-08-31: qty 2

## 2015-08-31 MED ORDER — LACTATED RINGERS IV SOLN: 500.0000 mL | Freq: Once | INTRAVENOUS | Status: DC

## 2015-08-31 MED ORDER — TERBUTALINE SULFATE 1 MG/ML IJ SOLN
0.2500 mg | Freq: Once | INTRAMUSCULAR | Status: DC | PRN
  Filled 2015-08-31: qty 1

## 2015-08-31 MED ORDER — FENTANYL 2.5 MCG/ML BUPIVACAINE 1/10 % EPIDURAL INFUSION (WH - ANES)
14.0000 mL/h | INTRAMUSCULAR | Status: DC | PRN
  Administered 2015-08-31 (×2): 14 mL/h via EPIDURAL
  Filled 2015-08-31 (×2): qty 125

## 2015-08-31 MED ORDER — PHENYLEPHRINE 40 MCG/ML (10ML) SYRINGE FOR IV PUSH (FOR BLOOD PRESSURE SUPPORT)
80.0000 ug | PREFILLED_SYRINGE | INTRAVENOUS | Status: DC | PRN
  Filled 2015-08-31: qty 2
  Filled 2015-08-31: qty 20

## 2015-08-31 MED ORDER — OXYTOCIN 10 UNIT/ML IJ SOLN
2.5000 [IU]/h | INTRAMUSCULAR | Status: DC
  Filled 2015-08-31: qty 4

## 2015-08-31 MED ORDER — FLEET ENEMA 7-19 GM/118ML RE ENEM: 1.0000 | ENEMA | RECTAL | Status: DC | PRN

## 2015-08-31 MED ORDER — LACTATED RINGERS IV SOLN
INTRAVENOUS | Status: DC
  Administered 2015-08-31 (×3): via INTRAVENOUS

## 2015-08-31 MED ORDER — OXYCODONE-ACETAMINOPHEN 5-325 MG PO TABS: 1.0000 | ORAL_TABLET | ORAL | Status: DC | PRN

## 2015-08-31 MED ORDER — EPHEDRINE 5 MG/ML INJ
10.0000 mg | INTRAVENOUS | Status: DC | PRN
  Filled 2015-08-31: qty 4
  Filled 2015-08-31: qty 2

## 2015-08-31 MED ORDER — OXYCODONE-ACETAMINOPHEN 5-325 MG PO TABS: 2.0000 | ORAL_TABLET | ORAL | Status: DC | PRN

## 2015-08-31 MED ORDER — CITRIC ACID-SODIUM CITRATE 334-500 MG/5ML PO SOLN: 30.0000 mL | ORAL | Status: DC | PRN

## 2015-08-31 MED ORDER — DIPHENHYDRAMINE HCL 50 MG/ML IJ SOLN: 12.5000 mg | INTRAMUSCULAR | Status: DC | PRN

## 2015-08-31 MED ORDER — LIDOCAINE HCL (PF) 1 % IJ SOLN
30.0000 mL | INTRAMUSCULAR | Status: DC | PRN
  Filled 2015-08-31: qty 30

## 2015-08-31 MED ORDER — LACTATED RINGERS IV SOLN
1.0000 m[IU]/min | INTRAVENOUS | Status: DC
  Administered 2015-08-31: 2 m[IU]/min via INTRAVENOUS

## 2015-08-31 MED ORDER — LIDOCAINE HCL (PF) 1 % IJ SOLN
INTRAMUSCULAR | Status: DC | PRN
  Administered 2015-08-31: 5 mL
  Administered 2015-08-31: 6 mL

## 2015-08-31 MED ORDER — ACETAMINOPHEN 325 MG PO TABS
650.0000 mg | ORAL_TABLET | ORAL | Status: DC | PRN
  Filled 2015-08-31: qty 2

## 2015-08-31 MED ORDER — ONDANSETRON HCL 4 MG/2ML IJ SOLN: 4.0000 mg | Freq: Four times a day (QID) | INTRAMUSCULAR | Status: DC | PRN

## 2015-08-31 MED ORDER — OXYTOCIN BOLUS FROM INFUSION
500.0000 mL | INTRAVENOUS | Status: DC
  Administered 2015-08-31: 500 mL via INTRAVENOUS

## 2015-08-31 MED ORDER — EPHEDRINE 5 MG/ML INJ
10.0000 mg | INTRAVENOUS | Status: DC | PRN
  Administered 2015-08-31: 10 mg via INTRAVENOUS
  Filled 2015-08-31: qty 2

## 2015-08-31 MED ORDER — LACTATED RINGERS IV SOLN
500.0000 mL | INTRAVENOUS | Status: DC | PRN
  Administered 2015-08-31: 500 mL via INTRAVENOUS
  Administered 2015-08-31: 300 mL via INTRAVENOUS

## 2015-08-31 NOTE — Consults (Signed)
  Anesthesia Pain Consult Note  Patient: Sierra Bridges, 35 y.o., female  Consult Requested by: Marcelle Overlie, MD  Reason for Consult: CRNA Pain rounds  Level of Consciousness: alert  Pain: none   Pain goal: 8

## 2015-08-31 NOTE — Anesthesia Preprocedure Evaluation (Addendum)
Anesthesia Evaluation  Patient identified by MRN, date of birth, ID band Patient awake    Reviewed: Allergy & Precautions, NPO status , Patient's Chart, lab work & pertinent test results  Airway Mallampati: II  TM Distance: >3 FB Neck ROM: Full    Dental no notable dental hx.    Pulmonary neg pulmonary ROS,    Pulmonary exam normal breath sounds clear to auscultation       Cardiovascular negative cardio ROS Normal cardiovascular exam Rhythm:Regular Rate:Normal     Neuro/Psych negative neurological ROS  negative psych ROS   GI/Hepatic negative GI ROS, Neg liver ROS,   Endo/Other  negative endocrine ROS  Renal/GU negative Renal ROS  negative genitourinary   Musculoskeletal negative musculoskeletal ROS (+)   Abdominal   Peds negative pediatric ROS (+)  Hematology negative hematology ROS (+)   Anesthesia Other Findings   Reproductive/Obstetrics negative OB ROS                             Anesthesia Physical Anesthesia Plan  ASA: II  Anesthesia Plan: Epidural   Post-op Pain Management:    Induction: Intravenous  Airway Management Planned: Natural Airway  Additional Equipment:   Intra-op Plan:   Post-operative Plan:   Informed Consent: I have reviewed the patients History and Physical, chart, labs and discussed the procedure including the risks, benefits and alternatives for the proposed anesthesia with the patient or authorized representative who has indicated his/her understanding and acceptance.   Dental advisory given  Plan Discussed with: CRNA  Anesthesia Plan Comments: (Informed consent obtained prior to proceeding including risk of failure, 1% risk of PDPH, risk of minor discomfort and bruising.  Discussed rare but serious complications including epidural abscess, permanent nerve injury, epidural hematoma.  Discussed alternatives to epidural analgesia and patient desires  to proceed.  Timeout performed pre-procedure verifying patient name, procedure, and platelet count.  Patient tolerated procedure well.)        Anesthesia Quick Evaluation  

## 2015-08-31 NOTE — H&P (Signed)
Sierra Bridges is a 35 y.o.G 5 P 2103 presents in earl y labor and BPs in triage consistently above 140s/ 90s. Patient complains of "feeling puffy." Maternal Medical History:  Reason for admission: Contractions.   Fetal activity: Perceived fetal activity is normal.    Prenatal complications: no prenatal complications   OB History    Gravida Para Term Preterm AB TAB SAB Ectopic Multiple Living   Past Medical History  Diagnosis Date  . Medical history non-contributory    Past Surgical History  Procedure Laterality Date  . Breast surgery    . Tonsillectomy     Family History: family history is negative for Arthritis, Alcohol abuse, Asthma, Birth defects, Cancer, COPD, Depression, Diabetes, Drug abuse, Early death, Hearing loss, Heart disease, Hyperlipidemia, Hypertension, Kidney disease, Learning disabilities, Mental illness, Mental retardation, Miscarriages / Stillbirths, Stroke, Vision loss, and Varicose Veins. Social History:  reports that she has never smoked. She does not have any smokeless tobacco history on file. She reports that she does not drink alcohol or use illicit drugs.   Prenatal Transfer Tool  Maternal Diabetes: No Genetic Screening: Normal Maternal Ultrasounds/Referrals: Normal Fetal Ultrasounds or other Referrals:  None Maternal Substance Abuse:  No Significant Maternal Medications:  None Significant Maternal Lab Results:  Lab values include: Other: none Other Comments:  None  Review of Systems  All other systems reviewed and are negative.   Dilation: 2 Effacement (%): 50 Station: -2 Exam by:: Morrison Old RN Blood pressure 139/93, pulse 93, temperature 98.2 F (36.8 C), temperature source Oral, resp. rate 18, height  (1.6 m), weight 75.751 kg (167 lb), unknown if currently breastfeeding. Maternal Exam:  Abdomen: Fetal presentation: vertex  Introitus: not evaluated.     Fetal Exam Fetal State Assessment: Category I - tracings  are normal.     Physical Exam  Prenatal labs: ABO, Rh: --/--/A POS (02/22 1155) Antibody: PENDING (02/22 1155) Rubella: Immune (08/11 0000) RPR: Non Reactive (02/20 2206)  HBsAg: Negative (08/11 0000)  HIV: Non-reactive (08/11 0000)  GBS: Negative (02/08 0000)   Assessment/Plan: IUP at 37 w 3 days Labor Elevated BPs Admit  Epidural Pitocin prn Odes Lolli L 08/31/2015, 1:12 PM

## 2015-08-31 NOTE — Anesthesia Procedure Notes (Signed)
Epidural Patient location during procedure: OB  Staffing Anesthesiologist: Sherrian Divers Performed by: anesthesiologist   Preanesthetic Checklist Completed: patient identified, site marked, surgical consent, pre-op evaluation, timeout performed, IV checked, risks and benefits discussed and monitors and equipment checked  Epidural Patient position: sitting Prep: DuraPrep Patient monitoring: heart rate and blood pressure Approach: midline Location: L4-L5 Injection technique: LOR saline  Needle:  Needle type: Tuohy  Needle gauge: 17 G Needle insertion depth: 5 cm Catheter type: closed end Catheter size: 19 Gauge Catheter at skin depth: 8 cm Test dose: negative  Assessment Events: blood not aspirated, injection not painful, no injection resistance, negative IV test and no paresthesia  Additional Notes Reason for block:procedure for pain

## 2015-08-31 NOTE — MAU Note (Signed)
Discharged from hospital yesterday @ 1600; back with lower back pain and vaginal pressure; is having spotting this AM; denies ?SROM

## 2015-08-31 NOTE — MAU Provider Note (Signed)
Chief Complaint:  Labor Eval   None     HPI: Sierra Bridges is a 35 y.o. [phone] at 25w3dwho presents to maternity admissions reporting cramping/contractions x 3 days, worsening today. She was seen in MAU yesterday and had elevated BP for the first time.  Her cramping pain is not relieved by warm bath, Tylenol, position change, or rest. She reports a h/a yesterday after Stadol was given in MAU, but denies h/a now.  She denies visual disturbances or epigastric pain. She reports good fetal movement, denies LOF, vaginal bleeding, vaginal itching/burning, urinary symptoms, h/a, dizziness, n/v, or fever/chills.    HPI  Past Medical History: Past Medical History  Diagnosis Date  . Medical history non-contributory     Past obstetric history: OB History  Gravida Para Term Preterm AB SAB TAB Ectopic Multiple Living  # Outcome Date GA Lbr Len/2nd Weight Sex Delivery Anes PTL Lv  5 Current           4 Preterm           3 Term           2 Term           1 Gravida               Past Surgical History: Past Surgical History  Procedure Laterality Date  . Breast surgery    . Tonsillectomy      Family History: Family History  Problem Relation Age of Onset  . Arthritis Neg Hx   . Alcohol abuse Neg Hx   . Asthma Neg Hx   . Birth defects Neg Hx   . Cancer Neg Hx   . COPD Neg Hx   . Depression Neg Hx   . Diabetes Neg Hx   . Drug abuse Neg Hx   . Early death Neg Hx   . Hearing loss Neg Hx   . Heart disease Neg Hx   . Hyperlipidemia Neg Hx   . Hypertension Neg Hx   . Kidney disease Neg Hx   . Learning disabilities Neg Hx   . Mental illness Neg Hx   . Mental retardation Neg Hx   . Miscarriages / Stillbirths Neg Hx   . Stroke Neg Hx   . Vision loss Neg Hx   . Varicose Veins Neg Hx     Social History: Social History  Substance Use Topics  . Smoking status: Never Smoker   . Smokeless tobacco: None  . Alcohol Use: No    Allergies:  Allergies  Allergen  Reactions  . Penicillins Rash    Has patient had a PCN reaction causing immediate rash, facial/tongue/throat swelling, SOB or lightheadedness with hypotension: Yes Has patient had a PCN reaction causing severe rash involving mucus membranes or skin necrosis: No Has patient had a PCN reaction that required hospitalization No Has patient had a PCN reaction occurring within the last 10 years: Yes If all of the above answers are "NO", then may proceed with Cephalosporin use.     Meds:  Prescriptions prior to admission  Medication Sig Dispense Refill Last Dose  . Cholecalciferol (VITAMIN D3) 10000 UNITS capsule Take 10,000 Units by mouth once a week. OTC supplement, Mondays   Past Week at Unknown time  . DICLEGIS 10-10 MG TBEC Take 2 tablets by mouth at bedtime.  1 Past Week at Unknown time  . Prenatal Vit-Fe Fumarate-FA (PRENATAL MULTIVITAMIN) TABS tablet  Take 1 tablet by mouth daily at 12 noon.    Past Week at Unknown time    ROS:  Review of Systems  Constitutional: Negative for fever, chills and fatigue.  Eyes: Negative for visual disturbance.  Respiratory: Negative for shortness of breath.   Cardiovascular: Negative for chest pain.  Gastrointestinal: Positive for abdominal pain. Negative for nausea and vomiting.  Genitourinary: Positive for pelvic pain. Negative for dysuria, flank pain, vaginal bleeding, vaginal discharge, difficulty urinating and vaginal pain.  Musculoskeletal: Positive for back pain.  Neurological: Negative for dizziness and headaches.  Psychiatric/Behavioral: Negative.      I have reviewed patient's Past Medical Hx, Surgical Hx, Family Hx, Social Hx, medications and allergies.   Physical Exam   Patient Vitals for the past 24 hrs:  BP Temp Temp src Pulse Resp  08/31/15 1043 148/99 mmHg - - 109 -  08/31/15 1033 150/97 mmHg - - 107 -  08/31/15 1023 (!) 151/106 mmHg - - 107 -  08/31/15 1016 136/89 mmHg - - 102 -  08/31/15 0957 140/89 mmHg 98.2 F (36.8 C)  Oral 112 16   Constitutional: Well-developed, well-nourished female in no acute distress.  Cardiovascular: normal rate Respiratory: normal effort GI: Abd soft, non-tender, gravid appropriate for gestational age.  MS: Extremities nontender, no edema, normal ROM Neurologic: Alert and oriented x 4.  GU: Neg CVAT.  PELVIC EXAM: Cervix pink, visually closed, without lesion, scant white creamy discharge, vaginal walls and external genitalia normal Bimanual exam: Cervix 0/long/high, firm, anterior, neg CMT, uterus nontender, nonenlarged, adnexa without tenderness, enlargement, or mass  Dilation: 2 Effacement (%): 50 Cervical Position: Middle Station: -2 Presentation: Vertex Exam by:: Morrison Old RN  FHT:  Baseline 135 moderate variability, accelerations present, no decelerations Contractions: q 4-7 mins, mild to moderate to palpation   Labs: Results for orders placed or performed during the hospital encounter of 08/31/15 (from the past 24 hour(s))  Urinalysis, Routine w reflex microscopic (not at [facility])     Status: Abnormal   Collection Time: 08/31/15  9:45 AM  Result Value Ref Range   Color, Urine YELLOW YELLOW   APPearance CLEAR CLEAR   Specific Gravity, Urine 1.010 1.005 - 1.030   pH 6.0 5.0 - 8.0   Glucose, UA NEGATIVE NEGATIVE mg/dL   Hgb urine dipstick LARGE (A) NEGATIVE   Bilirubin Urine NEGATIVE NEGATIVE   Ketones, ur NEGATIVE NEGATIVE mg/dL   Protein, ur NEGATIVE NEGATIVE mg/dL   Nitrite NEGATIVE NEGATIVE   Leukocytes, UA NEGATIVE NEGATIVE  Urine microscopic-add on     Status: Abnormal   Collection Time: 08/31/15  9:45 AM  Result Value Ref Range   Squamous Epithelial / LPF 0-5 (A) NONE SEEN   WBC, UA 0-5 0 - 5 WBC/hpf   RBC / HPF 0-5 0 - 5 RBC/hpf   Bacteria, UA RARE (A) NONE SEEN   --/--/A POS, A POS (02/20 2201)  Imaging:  No results found.  MAU Course/MDM: I have ordered labs and reviewed results.  Consult Dr Vincente Poli to review assessment and VS. Admit to  [facility] for IOL for GHTN at term.  Birthing Suites full currently so pt to wait in MAU for bed.  Assessment: 1. Gestational hypertension w/o significant proteinuria in 3rd trimester   G5P2103 @ [redacted]w[redacted]d with GHTN  Plan: Admit to [facility] Dr Vincente Poli to see pt     Medication List    ASK your doctor about these medications        DICLEGIS 10-10  MG Tbec  Generic drug:  Doxylamine-Pyridoxine  Take 2 tablets by mouth at bedtime.     prenatal multivitamin Tabs tablet  Take 1 tablet by mouth daily at 12 noon.     Vitamin D3 10000 units capsule  Take 10,000 Units by mouth once a week. OTC supplement, Mondays        Sharen Counter Certified Nurse-Midwife 08/31/2015 11:22 AM

## 2015-09-01 ENCOUNTER — Encounter (HOSPITAL_COMMUNITY): Payer: Self-pay | Admitting: *Deleted

## 2015-09-01 LAB — CBC
HEMATOCRIT: 27.8 % — AB (ref 36.0–46.0)
Hemoglobin: 9.1 g/dL — ABNORMAL LOW (ref 12.0–15.0)
MCH: 28.4 pg (ref 26.0–34.0)
MCHC: 32.7 g/dL (ref 30.0–36.0)
MCV: 86.9 fL (ref 78.0–100.0)
PLATELETS: 214 10*3/uL (ref 150–400)
RBC: 3.2 MIL/uL — AB (ref 3.87–5.11)
RDW: 13.9 % (ref 11.5–15.5)
WBC: 24.2 10*3/uL — AB (ref 4.0–10.5)

## 2015-09-01 LAB — RPR: RPR Ser Ql: NONREACTIVE

## 2015-09-01 MED ORDER — SIMETHICONE 80 MG PO CHEW: 80.0000 mg | CHEWABLE_TABLET | ORAL | Status: DC | PRN

## 2015-09-01 MED ORDER — MEDROXYPROGESTERONE ACETATE 150 MG/ML IM SUSP: 150.0000 mg | INTRAMUSCULAR | Status: DC | PRN

## 2015-09-01 MED ORDER — BENZOCAINE-MENTHOL 20-0.5 % EX AERO
1.0000 | INHALATION_SPRAY | CUTANEOUS | Status: DC | PRN
Start: 2015-09-01 — End: 2015-09-02
  Administered 2015-09-01 – 2015-09-02 (×2): 1 via TOPICAL
  Filled 2015-09-01 (×2): qty 56

## 2015-09-01 MED ORDER — SENNOSIDES-DOCUSATE SODIUM 8.6-50 MG PO TABS
2.0000 | ORAL_TABLET | ORAL | Status: DC
  Administered 2015-09-02: 2 via ORAL
  Filled 2015-09-01: qty 2

## 2015-09-01 MED ORDER — MEASLES, MUMPS & RUBELLA VAC ~~LOC~~ INJ
0.5000 mL | INJECTION | Freq: Once | SUBCUTANEOUS | Status: DC
  Filled 2015-09-01: qty 0.5

## 2015-09-01 MED ORDER — ONDANSETRON HCL 4 MG/2ML IJ SOLN: 4.0000 mg | INTRAMUSCULAR | Status: DC | PRN

## 2015-09-01 MED ORDER — DIPHENHYDRAMINE HCL 25 MG PO CAPS: 25.0000 mg | ORAL_CAPSULE | Freq: Four times a day (QID) | ORAL | Status: DC | PRN

## 2015-09-01 MED ORDER — ONDANSETRON HCL 4 MG PO TABS: 4.0000 mg | ORAL_TABLET | ORAL | Status: DC | PRN

## 2015-09-01 MED ORDER — PRENATAL MULTIVITAMIN CH
1.0000 | ORAL_TABLET | Freq: Every day | ORAL | Status: DC
  Administered 2015-09-01 – 2015-09-02 (×2): 1 via ORAL
  Filled 2015-09-01: qty 1

## 2015-09-01 MED ORDER — LANOLIN HYDROUS EX OINT: TOPICAL_OINTMENT | CUTANEOUS | Status: DC | PRN

## 2015-09-01 MED ORDER — ZOLPIDEM TARTRATE 5 MG PO TABS: 5.0000 mg | ORAL_TABLET | Freq: Every evening | ORAL | Status: DC | PRN

## 2015-09-01 MED ORDER — WITCH HAZEL-GLYCERIN EX PADS: 1.0000 "application " | MEDICATED_PAD | CUTANEOUS | Status: DC | PRN

## 2015-09-01 MED ORDER — TETANUS-DIPHTH-ACELL PERTUSSIS 5-2.5-18.5 LF-MCG/0.5 IM SUSP: 0.5000 mL | Freq: Once | INTRAMUSCULAR | Status: DC

## 2015-09-01 MED ORDER — DIBUCAINE 1 % RE OINT
1.0000 | TOPICAL_OINTMENT | RECTAL | Status: DC | PRN
Start: 2015-09-01 — End: 2015-09-02

## 2015-09-01 MED ORDER — ACETAMINOPHEN 325 MG PO TABS
650.0000 mg | ORAL_TABLET | ORAL | Status: DC | PRN
Start: 2015-09-01 — End: 2015-09-02
  Administered 2015-09-02 (×2): 650 mg via ORAL
  Filled 2015-09-01 (×2): qty 2

## 2015-09-01 MED ORDER — BISACODYL 10 MG RE SUPP: 10.0000 mg | Freq: Every day | RECTAL | Status: DC | PRN

## 2015-09-01 MED ORDER — FLEET ENEMA 7-19 GM/118ML RE ENEM: 1.0000 | ENEMA | Freq: Every day | RECTAL | Status: DC | PRN

## 2015-09-01 MED ORDER — IBUPROFEN 600 MG PO TABS
600.0000 mg | ORAL_TABLET | Freq: Four times a day (QID) | ORAL | Status: DC
  Administered 2015-09-01 – 2015-09-02 (×7): 600 mg via ORAL
  Filled 2015-09-01 (×6): qty 1

## 2015-09-01 NOTE — Progress Notes (Signed)
Post Partum Day 1 Subjective: no complaints, up ad lib, voiding and tolerating PO  Objective: Blood pressure 130/74, pulse 100, temperature 98.4 F (36.9 C), temperature source Oral, resp. rate 18, height  (1.6 m), weight 167 lb (75.751 kg), SpO2 98 %, unknown if currently breastfeeding.  Physical Exam:  General: alert and cooperative Lochia: appropriate Uterine Fundus: firm and non-tender Incision: perineum intact DVT Evaluation: No evidence of DVT seen on physical exam. Negative Homan's sign. No cords or calf tenderness. No significant calf/ankle edema.   Recent Labs  08/31/15 1155 09/01/15 0504  HGB 10.0* 9.1*  HCT 30.3* 27.8*    Assessment/Plan: Plan for discharge tomorrow  Repeat cbc in am   LOS: 1 day   CURTIS,CAROL G 09/01/2015, 8:25 AM

## 2015-09-01 NOTE — Lactation Note (Signed)
This note was copied from a baby's chart. Lactation Consultation Note Experienced BF mom excited about new little girl. Mom BF her other children up to 18 months. Denies any problems or infections. Mom has good breast tissue, and good everted nipples. Denies painful latch at this time. Baby is BF in cradle position. Encourage mom to keep cheek to breast. Baby is small 5.13lbs. Gave LPI information sheet even though baby is 37 3/7 wks. Mom encouraged to feed baby 8-12 times/24 hours and with feeding cues. Mom encouraged to waken baby for feeds. Encouraged mom not to feed over 30 min. Including supplementing. Mom shown how to use DEBP & how to disassemble, clean, & reassemble parts. Wanted to pump for breast stimulation. Encouraged to hand express after pumping. Mom knows to pump q3h for 15-20 min. Educated about newborn behavior, STS, I&O, cluster feeding, supply and demand. Referred to Baby and Me Book in Breastfeeding section Pg. 22-23 for position options and Proper latch demonstration. WH/LC brochure given w/resources, support groups and LC services. Patient Name: [name] [id number] Date: 09/01/2015 Reason for consult: Initial assessment   Maternal Data Has patient been taught Hand Expression?: Yes Does the patient have breastfeeding experience prior to this delivery?: Yes  Feeding Feeding Type: Breast Fed Length of feed: 15 min  LATCH Score/Interventions Latch: Grasps breast easily, tongue down, lips flanged, rhythmical sucking.  Audible Swallowing: Spontaneous and intermittent Intervention(s): Skin to skin;Hand expression  Type of Nipple: Everted at rest and after stimulation  Comfort (Breast/Nipple): Soft / non-tender     Hold (Positioning): No assistance needed to correctly position infant at breast.  LATCH Score: 10  Lactation Tools Discussed/Used Tools: Pump Breast pump type: Double-Electric Breast Pump WIC Program: No Pump Review: Setup, frequency, and  cleaning;Milk Storage Initiated by:: Peri Jefferson RN Date initiated:: 09/01/15   Consult Status Consult Status: Follow-up Date: 09/02/15 Follow-up type: In-patient    Charyl Dancer 09/01/2015, 2:35 AM

## 2015-09-01 NOTE — Anesthesia Postprocedure Evaluation (Signed)
Anesthesia Post Note  Patient: Sierra Bridges  Procedure(s) Performed: * No procedures listed *  Patient location during evaluation: Mother Baby Anesthesia Type: Epidural Level of consciousness: awake and alert and oriented Pain management: satisfactory to patient Vital Signs Assessment: post-procedure vital signs reviewed and stable Respiratory status: spontaneous breathing and nonlabored ventilation Cardiovascular status: stable Postop Assessment: no headache, no backache, no signs of nausea or vomiting, adequate PO intake and patient able to bend at knees (patient up walking) Anesthetic complications: no    Last Vitals:  Filed Vitals:   09/01/15 0140 09/01/15 0519  BP: 124/70 130/74  Pulse: 113 100  Temp: 38 C 36.9 C  Resp: 18 18    Last Pain:  Filed Vitals:   09/01/15 0520  PainSc: 3                  Yussuf Sawyers

## 2015-09-02 LAB — CBC
HCT: 26.4 % — ABNORMAL LOW (ref 36.0–46.0)
HEMOGLOBIN: 8.6 g/dL — AB (ref 12.0–15.0)
MCH: 28.8 pg (ref 26.0–34.0)
MCHC: 32.6 g/dL (ref 30.0–36.0)
MCV: 88.3 fL (ref 78.0–100.0)
PLATELETS: 226 10*3/uL (ref 150–400)
RBC: 2.99 MIL/uL — AB (ref 3.87–5.11)
RDW: 14.2 % (ref 11.5–15.5)
WBC: 15.2 10*3/uL — ABNORMAL HIGH (ref 4.0–10.5)

## 2015-09-02 MED ORDER — IBUPROFEN 600 MG PO TABS: 600.0000 mg | ORAL_TABLET | Freq: Four times a day (QID) | ORAL | Status: DC

## 2015-09-02 NOTE — Lactation Note (Signed)
This note was copied from a baby's chart. Lactation Consultation Note  Patient Name: Sierra Bridges [id number] Date: 09/02/2015 Reason for consult: Follow-up assessment;Infant < 6lbs;Other (Comment);Breast surgery (early term baby/br. augmentation) BF 11 times in 24 hours on average 20-25 minutes. 6 voids/5 stools in 24 hours, 4% weight loss, baby now 34 hours old, bili 9.0 at 32 hours, high/intermediate 75% risk zone.  Mom is experienced BF and reports this baby is nursing well, she is hearing swallows when baby is at the breast. Mom has history of breast augmentation but has had good milk supply with older children and breast fed for greater than 1 year. Advised baby should be at the breast 8-12 times and with feeding ques. Mom reports breasts are starting to fill, no engorgement reported. Engorgement care reviewed if needed. Mom denies questions/concerns. Offered to observe latch, Mom declined. Advised of OP services and support group. Call for questions/concerns.   Maternal Data    Feeding Feeding Type: Breast Fed Length of feed: 30 min  LATCH Score/Interventions Latch: Grasps breast easily, tongue down, lips flanged, rhythmical sucking.  Audible Swallowing: Spontaneous and intermittent Intervention(s): Skin to skin  Type of Nipple: Everted at rest and after stimulation  Comfort (Breast/Nipple): Soft / non-tender     Hold (Positioning): No assistance needed to correctly position infant at breast.  LATCH Score: 10  Lactation Tools Discussed/Used Tools: Pump Breast pump type: Double-Electric Breast Pump   Consult Status Consult Status: Complete Date: 09/02/15 Follow-up type: In-patient    Alfred Levins 09/02/2015, 9:19 AM

## 2015-09-02 NOTE — Discharge Summary (Signed)
Obstetric Discharge Summary Reason for Admission: induction of labor Prenatal Procedures: ultrasound Intrapartum Procedures: spontaneous vaginal delivery Postpartum Procedures: none Complications-Operative and Postpartum: none HEMOGLOBIN  Date Value Ref Range Status  09/02/2015 8.6* 12.0 - 15.0 g/dL Final   HCT  Date Value Ref Range Status  09/02/2015 26.4* 36.0 - 46.0 % Final    Physical Exam:  General: alert and cooperative Lochia: appropriate Uterine Fundus: firm Incision: perineum intact DVT Evaluation: No evidence of DVT seen on physical exam. Negative Homan's sign. No cords or calf tenderness. No significant calf/ankle edema.  Discharge Diagnoses: Term Pregnancy-delivered  Discharge Information: Date: 09/02/2015 Activity: pelvic rest Diet: routine Medications: PNV and Ibuprofen Condition: stable Instructions: refer to practice specific booklet Discharge to: home   Newborn Data: Live born female  Birth Weight: 5 lb 13.8 oz (2659 g) APGAR: 7, 8  Home with mother.  Mili Piltz G 09/02/2015, 8:15 AM

## 2015-09-04 ENCOUNTER — Ambulatory Visit: Payer: Self-pay

## 2015-09-04 NOTE — Lactation Note (Signed)
This note was copied from a baby's chart. Lactation Consultation Note  Patient Name: Sierra Bridges [id number] Date: 09/04/2015 Reason for consult: Follow-up assessment  With this mom and early term baby, now 65 hours old, and  Now 37 5/7 weeks CGA, and weight last evening 5 pounds 7.5 oz, and 7% weight loss. Mom reports baby breastfeeding well, with lots of audible swallows. Baby still under DPT lights. Mom's breast are very full/getting hard on the lateral side of both breasts. i advised mom to pump after breastfeeding to comfort, to protect her milk supply. Mom said she feels more comfortable after breast feeding, and the baby is feeding frequently. Mom has had early ter babies in the past, and feels confident in protecting her milk supply and feeding the baby. Mom has not supplemented any of her EBM, and does not want to use a bottle/nipple. I gave mom a foley cup, and explained to her how it is used, in case she wants to supplement, or needs to supplement. Baby has not been seen by pediatrician today yet, but mom is hoping to go home. Mom will call for any further questions/concerns.    Maternal Data    Feeding Feeding Type: Breast Fed  LATCH Score/Interventions Latch: Grasps breast easily, tongue down, lips flanged, rhythmical sucking.  Audible Swallowing: Spontaneous and intermittent  Type of Nipple: Everted at rest and after stimulation  Comfort (Breast/Nipple): Soft / non-tender  Problem noted: Filling;Mild/Moderate discomfort (mom reports discomfort relieved after breast feeding)  Hold (Positioning): No assistance needed to correctly position infant at breast.  LATCH Score: 10  Lactation Tools Discussed/Used     Consult Status Consult Status: Complete Follow-up type: Call as needed    Alfred Levins 09/04/2015, 10:47 AM

## 2018-07-20 ENCOUNTER — Encounter (HOSPITAL_COMMUNITY): Payer: Self-pay | Admitting: *Deleted

## 2018-07-20 ENCOUNTER — Inpatient Hospital Stay (HOSPITAL_COMMUNITY)
Admission: AD | Admit: 2018-07-20 | Discharge: 2018-07-20 | Disposition: A | Discharge status: Home / Self Care | Payer: Self-pay | Source: Ambulatory Visit | Attending: Obstetrics and Gynecology | Admitting: Obstetrics and Gynecology

## 2018-07-20 DIAGNOSIS — O26891 Other specified pregnancy related conditions, first trimester: Secondary | ICD-10-CM | POA: Insufficient documentation

## 2018-07-20 DIAGNOSIS — Z3491 Encounter for supervision of normal pregnancy, unspecified, first trimester: Secondary | ICD-10-CM

## 2018-07-20 DIAGNOSIS — Z3A09 9 weeks gestation of pregnancy: Secondary | ICD-10-CM | POA: Insufficient documentation

## 2018-07-20 DIAGNOSIS — O26851 Spotting complicating pregnancy, first trimester: Secondary | ICD-10-CM | POA: Insufficient documentation

## 2018-07-20 DIAGNOSIS — Z880 Allergy status to penicillin: Secondary | ICD-10-CM | POA: Insufficient documentation

## 2018-07-20 DIAGNOSIS — O209 Hemorrhage in early pregnancy, unspecified: Secondary | ICD-10-CM

## 2018-07-20 DIAGNOSIS — R42 Dizziness and giddiness: Secondary | ICD-10-CM | POA: Insufficient documentation

## 2018-07-20 LAB — URINALYSIS, ROUTINE W REFLEX MICROSCOPIC
Bacteria, UA: NONE SEEN
Bilirubin Urine: NEGATIVE
GLUCOSE, UA: NEGATIVE mg/dL
Ketones, ur: NEGATIVE mg/dL
Leukocytes, UA: NEGATIVE
Nitrite: NEGATIVE
PH: 7 (ref 5.0–8.0)
Protein, ur: NEGATIVE mg/dL
SPECIFIC GRAVITY, URINE: 1.009 (ref 1.005–1.030)

## 2018-07-20 LAB — CBC WITH DIFFERENTIAL/PLATELET
BASOS PCT: 0 %
Basophils Absolute: 0 10*3/uL (ref 0.0–0.1)
EOS PCT: 2 %
Eosinophils Absolute: 0.1 10*3/uL (ref 0.0–0.5)
HCT: 35.9 % — ABNORMAL LOW (ref 36.0–46.0)
Hemoglobin: 11.7 g/dL — ABNORMAL LOW (ref 12.0–15.0)
LYMPHS PCT: 19 %
Lymphs Abs: 1.4 10*3/uL (ref 0.7–4.0)
MCH: 29.2 pg (ref 26.0–34.0)
MCHC: 32.6 g/dL (ref 30.0–36.0)
MCV: 89.5 fL (ref 80.0–100.0)
MONO ABS: 0.3 10*3/uL (ref 0.1–1.0)
Monocytes Relative: 4 %
NEUTROS PCT: 75 %
NRBC: 0 % (ref 0.0–0.2)
Neutro Abs: 5.5 10*3/uL (ref 1.7–7.7)
PLATELETS: 213 10*3/uL (ref 150–400)
RBC: 4.01 MIL/uL (ref 3.87–5.11)
RDW: 14.3 % (ref 11.5–15.5)
WBC: 7.2 10*3/uL (ref 4.0–10.5)

## 2018-07-20 LAB — BASIC METABOLIC PANEL
ANION GAP: 7 (ref 5–15)
BUN: 13 mg/dL (ref 6–20)
CALCIUM: 8.8 mg/dL — AB (ref 8.9–10.3)
CO2: 24 mmol/L (ref 22–32)
Chloride: 104 mmol/L (ref 98–111)
Creatinine, Ser: 0.76 mg/dL (ref 0.44–1.00)
GFR calc Af Amer: >60 mL/min (ref >60)
GLUCOSE: 111 mg/dL — AB (ref 70–99)
Potassium: 4 mmol/L (ref 3.5–5.1)
Sodium: 135 mmol/L (ref 135–145)

## 2018-07-20 LAB — POCT PREGNANCY, URINE: Preg Test, Ur: POSITIVE — AB

## 2018-07-20 LAB — GLUCOSE, CAPILLARY: Glucose-Capillary: 102 mg/dL — ABNORMAL HIGH (ref 70–99)

## 2018-07-20 NOTE — MAU Provider Note (Signed)
History     CSN: [id number]  Arrival date and time: 07/20/18 6503   First Provider Initiated Contact with Patient 07/20/18 0602      Chief Complaint  Patient presents with  . Vaginal Bleeding  . Loss of Consciousness   HPI Sierra Bridges is a 38 y.o. [id number] [redacted]w[redacted]d by LMP who presents to MAU with chief complaint of vaginal spotting and syncopal episode this morning around 0400.  Syncopal episode Patient states she woke up to use the bathroom this morning and felt new onset lightheadedness. She called out to her husband for a glass of water then fainted. Her husband was in the room for this event and states she broke out in a cold sweat and was "completely passed out for about ten seconds". He was able to catch her and gently lower her to the floor. She denies history of seuizures or syncopal episodes. She denies physical trauma related to this event.  Vaginal spotting Patient noted pink-tinged fluid on her toilet paper after wiping. She donned a pad and has not seen any spotting since that time.  Patient states she had confirmed IUP via clinic ultrasound and was diagnosed with a  Subchorionic hemorrhage.   OB History    Gravida  6   Para  4   Term  3   Preterm  1   AB      Living  4     SAB      TAB      Ectopic      Multiple  0   Live Births  1           Past Medical History:  Diagnosis Date  . Medical history non-contributory     Past Surgical History:  Procedure Laterality Date  . BREAST SURGERY    . TONSILLECTOMY      Family History  Problem Relation Age of Onset  . Arthritis Neg Hx   . Alcohol abuse Neg Hx   . Asthma Neg Hx   . Birth defects Neg Hx   . Cancer Neg Hx   . COPD Neg Hx   . Depression Neg Hx   . Diabetes Neg Hx   . Drug abuse Neg Hx   . Early death Neg Hx   . Hearing loss Neg Hx   . Heart disease Neg Hx   . Hyperlipidemia Neg Hx   . Hypertension Neg Hx   . Kidney disease Neg Hx   . Learning disabilities Neg Hx   .  Mental illness Neg Hx   . Mental retardation Neg Hx   . Miscarriages / Stillbirths Neg Hx   . Stroke Neg Hx   . Vision loss Neg Hx   . Varicose Veins Neg Hx     Social History   Tobacco Use  . Smoking status: Never Smoker  . Smokeless tobacco: Never Used  Substance Use Topics  . Alcohol use: No  . Drug use: No    Allergies:  Allergies  Allergen Reactions  . Penicillins Rash    Has patient had a PCN reaction causing immediate rash, facial/tongue/throat swelling, SOB or lightheadedness with hypotension: Yes Has patient had a PCN reaction causing severe rash involving mucus membranes or skin necrosis: No Has patient had a PCN reaction that required hospitalization No Has patient had a PCN reaction occurring within the last 10 years: Yes If all of the above answers are "NO", then may proceed with Cephalosporin use.     Medications  Prior to Admission  Medication Sig Dispense Refill Last Dose  . Prenatal Vit-Fe Fumarate-FA (PRENATAL MULTIVITAMIN) TABS tablet Take 1 tablet by mouth daily at 12 noon.    07/19/2018 at Unknown time  . Cholecalciferol (VITAMIN D3) 10000 UNITS capsule Take 10,000 Units by mouth once a week. OTC supplement, Mondays   Past Week at Unknown time  . ibuprofen (ADVIL,MOTRIN) 600 MG tablet Take 1 tablet (600 mg total) by mouth every 6 (six) hours. 30 tablet 1     Review of Systems  Constitutional: Negative for chills, fatigue and fever.  Gastrointestinal: Negative for abdominal pain.  Genitourinary: Positive for vaginal bleeding. Negative for difficulty urinating, flank pain, vaginal discharge and vaginal pain.  Musculoskeletal: Negative for back pain.  Neurological: Negative for headaches.   Physical Exam   Blood pressure 122/71, pulse 71, temperature 98.3 F (36.8 C), resp. rate 20, height 5\' 3"  (1.6 m), weight 67.6 kg, last menstrual period 05/12/2018, unknown if currently breastfeeding.  Physical Exam  Nursing note and vitals  reviewed. Constitutional: She is oriented to person, place, and time. She appears well-developed and well-nourished.  Respiratory: Effort normal. No respiratory distress.  GI: Soft. Bowel sounds are normal. She exhibits no distension. There is no abdominal tenderness. There is no rebound, no guarding and no CVA tenderness.  Genitourinary:    Vaginal discharge present.     Genitourinary Comments: Scant blood near cervical os, removed with fox swab x 1. No new bleeding observed   Neurological: She is alert and oriented to person, place, and time.  Skin: Skin is warm.  Psychiatric: She has a normal mood and affect. Her behavior is normal. Thought content normal.    MAU Course/MDM  Procedures: sterile speculum exam  --FHR 155 by bedside ultrasound. Patient informed that the ultrasound is considered a limited obstetric ultrasound and is not intended to be a complete ultrasound exam.  Patient also informed that the ultrasound is not being completed with the intent of assessing for fetal or placental anomalies or any pelvic abnormalities.  Explained that the purpose of today's ultrasound is to assess for fetal heart rate.  Patient acknowledges the purpose of the exam and the limitations of the study.    --Hgb in UA likely cross contamination due to collection technique. No history of UTIs or kidney stones, non tender abdomen, denies urinary symptoms, denies pelvic pain, afebrile.  Patient Vitals for the past 24 hrs:  BP Temp Pulse Resp SpO2 Height Weight  07/20/18 0726 126/72 - 80 20 99 % - -  07/20/18 0534 122/71 98.3 F (36.8 C) 71 20 - 5\' 3"  (1.6 m) 67.6 kg    Results for orders placed or performed during the hospital encounter of 07/20/18 (from the past 24 hour(s))  Urinalysis, Routine w reflex microscopic     Status: Abnormal   Collection Time: 07/20/18  5:56 AM  Result Value Ref Range   Color, Urine YELLOW YELLOW   APPearance HAZY (A) CLEAR   Specific Gravity, Urine 1.009 1.005 - 1.030    pH 7.0 5.0 - 8.0   Glucose, UA NEGATIVE NEGATIVE mg/dL   Hgb urine dipstick MODERATE (A) NEGATIVE   Bilirubin Urine NEGATIVE NEGATIVE   Ketones, ur NEGATIVE NEGATIVE mg/dL   Protein, ur NEGATIVE NEGATIVE mg/dL   Nitrite NEGATIVE NEGATIVE   Leukocytes, UA NEGATIVE NEGATIVE   RBC / HPF 6-10 0 - 5 RBC/hpf   WBC, UA 0-5 0 - 5 WBC/hpf   Bacteria, UA NONE SEEN NONE SEEN  Squamous Epithelial / LPF 0-5 0 - 5   Mucus PRESENT   Pregnancy, urine POC     Status: Abnormal   Collection Time: 07/20/18  6:00 AM  Result Value Ref Range   Preg Test, Ur POSITIVE (A) NEGATIVE  Glucose, capillary     Status: Abnormal   Collection Time: 07/20/18  6:20 AM  Result Value Ref Range   Glucose-Capillary 102 (H) 70 - 99 mg/dL  CBC with Differential/Platelet     Status: Abnormal   Collection Time: 07/20/18  6:40 AM  Result Value Ref Range   WBC 7.2 4.0 - 10.5 K/uL   RBC 4.01 3.87 - 5.11 MIL/uL   Hemoglobin 11.7 (L) 12.0 - 15.0 g/dL   HCT 44.0 (L) 34.7 - 42.5 %   MCV 89.5 80.0 - 100.0 fL   MCH 29.2 26.0 - 34.0 pg   MCHC 32.6 30.0 - 36.0 g/dL   RDW 95.6 38.7 - 56.4 %   Platelets 213 150 - 400 K/uL   nRBC 0.0 0.0 - 0.2 %   Neutrophils Relative % 75 %   Neutro Abs 5.5 1.7 - 7.7 K/uL   Lymphocytes Relative 19 %   Lymphs Abs 1.4 0.7 - 4.0 K/uL   Monocytes Relative 4 %   Monocytes Absolute 0.3 0.1 - 1.0 K/uL   Eosinophils Relative 2 %   Eosinophils Absolute 0.1 0.0 - 0.5 K/uL   Basophils Relative 0 %   Basophils Absolute 0.0 0.0 - 0.1 K/uL  Basic metabolic panel     Status: Abnormal   Collection Time: 07/20/18  6:40 AM  Result Value Ref Range   Sodium 135 135 - 145 mmol/L   Potassium 4.0 3.5 - 5.1 mmol/L   Chloride 104 98 - 111 mmol/L   CO2 24 22 - 32 mmol/L   Glucose, Bld 111 (H) 70 - 99 mg/dL   BUN 13 6 - 20 mg/dL   Creatinine, Ser 3.32 0.44 - 1.00 mg/dL   Calcium 8.8 (L) 8.9 - 10.3 mg/dL   GFR calc non Af Amer >60 >60 mL/min   GFR calc Af Amer >60 >60 mL/min   Anion gap 7 5 - 15      Assessment and Plan  --38 y.o. [id number] at [redacted] weeks GA --FHT 155 --Subchorionic hemorrhage previously noted, reviewed pelvic rest and bleeding precautions --Discharge home in stable condition  F/U: OB appt next week  Calvert Cantor, CNM 07/20/2018, 7:31 AM

## 2018-07-20 NOTE — MAU Note (Signed)
Got up to BR at 0400 and was not feeling well. Saw dime-sized blood clot. Noticed some sore to L of belly button when got up but not there now. States felt weird and "passed out". Did not hit floor. Spouse was near her and eased her to floor. Now just feels washed out but no pain and has not felt anymore bleeding but has not been back to BR.  Had SAB just before this pregnancy. LMP 05/12/18

## 2018-07-20 NOTE — Discharge Instructions (Signed)
Subchorionic Hematoma ° °A subchorionic hematoma is a gathering of blood between the outer wall of the embryo (chorion) and the inner wall of the womb (uterus). °This condition can cause vaginal bleeding. If they cause little or no vaginal bleeding, early small hematomas usually shrink on their own and do not affect your baby or pregnancy. When bleeding starts later in pregnancy, or if the hematoma is larger or occurs in older pregnant women, the condition may be more serious. Larger hematomas may get bigger, which increases the chances of miscarriage. This condition also increases the risk of: °· Premature separation of the placenta from the uterus. °· Premature (preterm) labor. °· Stillbirth. °What are the causes? °The exact cause of this condition is not known. It occurs when blood is trapped between the placenta and the uterine wall because the placenta has separated from the original site of implantation. °What increases the risk? °You are more likely to develop this condition if: °· You were treated with fertility medicines. °· You conceived through in vitro fertilization (IVF). °What are the signs or symptoms? °Symptoms of this condition include: °· Vaginal spotting or bleeding. °· Contractions of the uterus. These cause abdominal pain. °Sometimes you may have no symptoms and the bleeding may only be seen when ultrasound images are taken (transvaginal ultrasound). °How is this diagnosed? °This condition is diagnosed based on a physical exam. This includes a pelvic exam. You may also have other tests, including: °· Blood tests. °· Urine tests. °· Ultrasound of the abdomen. °How is this treated? °Treatment for this condition can vary. Treatment may include: °· Watchful waiting. You will be monitored closely for any changes in bleeding. During this stage: °? The hematoma may be reabsorbed by the body. °? The hematoma may separate the fluid-filled space containing the embryo (gestational sac) from the wall of the  womb (endometrium). °· Medicines. °· Activity restriction. This may be needed until the bleeding stops. °Follow these instructions at home: °· Stay on bed rest if told to do so by your health care provider. °· Do not lift anything that is heavier than 10 lbs. (4.5 kg) or as told by your health care provider. °· Do not use any products that contain nicotine or tobacco, such as cigarettes and e-cigarettes. If you need help quitting, ask your health care provider. °· Track and write down the number of pads you use each day and how soaked (saturated) they are. °· Do not use tampons. °· Keep all follow-up visits as told by your health care provider. This is important. Your health care provider may ask you to have follow-up blood tests or ultrasound tests or both. °Contact a health care provider if: °· You have any vaginal bleeding. °· You have a fever. °Get help right away if: °· You have severe cramps in your stomach, back, abdomen, or pelvis. °· You pass large clots or tissue. Save any tissue for your health care provider to look at. °· You have more vaginal bleeding, and you faint or become lightheaded or weak. °Summary °· A subchorionic hematoma is a gathering of blood between the outer wall of the placenta and the uterus. °· This condition can cause vaginal bleeding. °· Sometimes you may have no symptoms and the bleeding may only be seen when ultrasound images are taken. °· Treatment may include watchful waiting, medicines, or activity restriction. °This information is not intended to replace advice given to you by your health care provider. Make sure you discuss any questions you   have with your health care provider. °Document Released: 10/10/2006 Document Revised: 08/21/2016 Document Reviewed: 08/21/2016 °[employer] Interactive Patient Education © 2019 [employer] ° °

## 2019-02-20 ENCOUNTER — Inpatient Hospital Stay (HOSPITAL_COMMUNITY)
Admission: AD | Admit: 2019-02-20 | Discharge: 2019-02-21 | Disposition: A | Discharge status: Home / Self Care | Payer: Self-pay | Attending: Obstetrics & Gynecology | Admitting: Obstetrics & Gynecology

## 2019-02-20 ENCOUNTER — Encounter (HOSPITAL_COMMUNITY): Payer: Self-pay | Admitting: *Deleted

## 2019-02-20 ENCOUNTER — Other Ambulatory Visit: Payer: Self-pay

## 2019-02-20 DIAGNOSIS — O26811 Pregnancy related exhaustion and fatigue, first trimester: Secondary | ICD-10-CM | POA: Insufficient documentation

## 2019-02-20 DIAGNOSIS — Z3A11 11 weeks gestation of pregnancy: Secondary | ICD-10-CM | POA: Insufficient documentation

## 2019-02-20 DIAGNOSIS — Z880 Allergy status to penicillin: Secondary | ICD-10-CM | POA: Insufficient documentation

## 2019-02-20 DIAGNOSIS — O210 Mild hyperemesis gravidarum: Secondary | ICD-10-CM | POA: Insufficient documentation

## 2019-02-20 DIAGNOSIS — R110 Nausea: Secondary | ICD-10-CM

## 2019-02-20 LAB — URINALYSIS, ROUTINE W REFLEX MICROSCOPIC
Bilirubin Urine: NEGATIVE
Glucose, UA: NEGATIVE mg/dL
Hgb urine dipstick: NEGATIVE
Ketones, ur: NEGATIVE mg/dL
Leukocytes,Ua: NEGATIVE
Nitrite: NEGATIVE
Protein, ur: NEGATIVE mg/dL
Specific Gravity, Urine: 1.011 (ref 1.005–1.030)
pH: 6 (ref 5.0–8.0)

## 2019-02-20 MED ORDER — PROMETHAZINE HCL 25 MG PO TABS
12.5000 mg | ORAL_TABLET | Freq: Once | ORAL | Status: AC
  Administered 2019-02-20: 12.5 mg via ORAL
  Filled 2019-02-20: qty 1

## 2019-02-20 NOTE — MAU Note (Addendum)
Have felt tired for wks. Treated for uti with Rocephin shot this past TUes. Just keep feeling really tired. I have 4 children at home and have never felt this weak and tired. Also have a headache. Occ upper abd pain. Was sharp few days ago but now less intense and dull

## 2019-02-20 NOTE — MAU Provider Note (Signed)
History     CSN: [id number]  Arrival date and time: 02/20/19 2114   First Provider Initiated Contact with Patient 02/20/19 2256      Chief Complaint  Patient presents with  . Fatigue  . Urinary Tract Infection   Sierra Bridges is a 39 y.o. [id number] at [redacted]w[redacted]d who receives care at [facility].  She presents today for Fatigue and Urinary Tract Infection.  She states she was diagnosed with a UTI last week and received a shot of rocephin.  She states that since then she has been feeling "sick."  She describes this as feeling fatigued and without energy.  She reports that she feels very weak to the point where she can't care for herself or her children.  Patient reports some abdominal pain that is intermittent that causes sharp shooting pain, but only lasts 5 minutes.  She states she has not noticed a pattern with the pain.  She reports that she does not work outside the home and no one in her home is sick. She reports her husband is a Engineer, building services and does Psychologist, counselling.  She reports having a CoVid test February 06, 2019 and it was negative. Patient also shows provider a report of her CBC with diff on Aug 6th that was WNL.  She reports that she is taking a Vitamin D supplement and a PNV daily.       OB History    Gravida  7   Para  4   Term  3   Preterm  1   AB  2   Living  4     SAB  2   TAB      Ectopic      Multiple  0   Live Births  4           Past Medical History:  Diagnosis Date  . Medical history non-contributory     Past Surgical History:  Procedure Laterality Date  . BREAST SURGERY    . TONSILLECTOMY      Family History  Problem Relation Age of Onset  . Arthritis Neg Hx   . Alcohol abuse Neg Hx   . Asthma Neg Hx   . Birth defects Neg Hx   . Cancer Neg Hx   . COPD Neg Hx   . Depression Neg Hx   . Diabetes Neg Hx   . Drug abuse Neg Hx   . Early death Neg Hx   . Hearing loss Neg Hx   . Heart disease Neg Hx   . Hyperlipidemia Neg Hx   .  Hypertension Neg Hx   . Kidney disease Neg Hx   . Learning disabilities Neg Hx   . Mental illness Neg Hx   . Mental retardation Neg Hx   . Miscarriages / Stillbirths Neg Hx   . Stroke Neg Hx   . Vision loss Neg Hx   . Varicose Veins Neg Hx     Social History   Tobacco Use  . Smoking status: Never Smoker  . Smokeless tobacco: Never Used  Substance Use Topics  . Alcohol use: No  . Drug use: No    Allergies:  Allergies  Allergen Reactions  . Penicillins Rash    Has patient had a PCN reaction causing immediate rash, facial/tongue/throat swelling, SOB or lightheadedness with hypotension: Yes Has patient had a PCN reaction causing severe rash involving mucus membranes or skin necrosis: No Has patient had a PCN reaction that required hospitalization No Has  patient had a PCN reaction occurring within the last 10 years: Yes If all of the above answers are "NO", then may proceed with Cephalosporin use.     Medications Prior to Admission  Medication Sig Dispense Refill Last Dose  . Cholecalciferol (VITAMIN D3) 10000 UNITS capsule Take 10,000 Units by mouth once a week. OTC supplement, Mondays     . Prenatal Vit-Fe Fumarate-FA (PRENATAL MULTIVITAMIN) TABS tablet Take 1 tablet by mouth daily at 12 noon.        Review of Systems  Constitutional: Positive for appetite change (Decrease), fatigue and fever (102.1-8/06/2019). Negative for chills.  HENT: Negative for congestion, rhinorrhea, sinus pressure, sinus pain, sneezing and sore throat.   Respiratory: Positive for shortness of breath. Negative for cough.   Gastrointestinal: Positive for abdominal pain (Sharp), nausea and vomiting (Mild). Negative for constipation and diarrhea.  Genitourinary: Negative for difficulty urinating, dysuria, vaginal bleeding and vaginal discharge.  Musculoskeletal: Positive for myalgias. Negative for back pain.  Neurological: Positive for headaches (6/10). Negative for dizziness and light-headedness.    Physical Exam   Blood pressure 136/90, pulse 91, temperature 98.5 F (36.9 C), resp. rate 16, height 5\' 3"  (1.6 m), weight 73 kg, last menstrual period 05/12/2018, SpO2 100 %, unknown if currently breastfeeding.  Physical Exam  Constitutional: She is oriented to person, place, and time. She appears well-developed and well-nourished.  HENT:  Head: Normocephalic and atraumatic.  Eyes: Conjunctivae are normal.  Neck: Normal range of motion.  Cardiovascular: Normal rate, regular rhythm and normal heart sounds.  Respiratory: Effort normal.  GI: Soft. Bowel sounds are normal. There is no abdominal tenderness.  Musculoskeletal: Normal range of motion.  Neurological: She is alert and oriented to person, place, and time.  Skin: Skin is warm and dry.  Psychiatric: She has a normal mood and affect. Her behavior is normal.    MAU Course  Procedures Results for orders placed or performed during the hospital encounter of 02/20/19 (from the past 24 hour(s))  Urinalysis, Routine w reflex microscopic     Status: Abnormal   Collection Time: 02/20/19 10:20 PM  Result Value Ref Range   Color, Urine STRAW (A) YELLOW   APPearance CLEAR CLEAR   Specific Gravity, Urine 1.011 1.005 - 1.030   pH 6.0 5.0 - 8.0   Glucose, UA NEGATIVE NEGATIVE mg/dL   Hgb urine dipstick NEGATIVE NEGATIVE   Bilirubin Urine NEGATIVE NEGATIVE   Ketones, ur NEGATIVE NEGATIVE mg/dL   Protein, ur NEGATIVE NEGATIVE mg/dL   Nitrite NEGATIVE NEGATIVE   Leukocytes,Ua NEGATIVE NEGATIVE    MDM Physical Exam AntiEmetic Labs Suggested: Covid and CBC with Diff Assessment and Plan  38 year old [id number] at 11.4 weeks Nausea Fatigue  -Exam findings discussed. -Patient questions if she should repeat Covid testing and other lab work. -Discussed how testing was recent and symptoms have not changed.  Encouraged to consider all aspects including personal feelings and financial status. -Patient further informed that testing could  be repeated at primary ob and that testing today could be inconclusive or require additional testing. -Reassured that symptoms may resolve once out of 1st trimester. -Patient opts to not have repeat testing today. -Offered and accepts medication for nausea. -Will give phenergan 12.5mg  now -Informed that medication may cause some drowsiness.    Cherre RobinsJessica L Mykira Hofmeister MSN, CNM 02/20/2019, 10:56 PM   Reassessment (12:23 AM)  -Patient requests discharge. -Rx for phenergan sent to pharmacy on file. -Will send VM to office regarding visit today and request  well-check phone call for patient. -Encouraged to call or return to MAU if symptoms worsen or with the onset of new symptoms. -Discharged to home in stable condition.  Cherre RobinsJessica L Jaxten Brosh MSN, CNM

## 2019-02-21 MED ORDER — PROMETHAZINE HCL 12.5 MG PO TABS: 12.5000 mg | ORAL_TABLET | Freq: Four times a day (QID) | ORAL | 0 refills | Status: AC | PRN

## 2019-02-21 NOTE — Discharge Instructions (Signed)
First Trimester of Pregnancy The first trimester of pregnancy is from week 1 until the end of week 13 (months 1 through 3). A week after a sperm fertilizes an egg, the egg will implant on the wall of the uterus. This embryo will begin to develop into a baby. Genes from you and your partner will form the baby. The female genes will determine whether the baby will be a boy or a girl. At 6-8 weeks, the eyes and face will be formed, and the heartbeat can be seen on ultrasound. At the end of 12 weeks, all the baby's organs will be formed. Now that you are pregnant, you will want to do everything you can to have a healthy baby. Two of the most important things are to get good prenatal care and to follow your health care provider's instructions. Prenatal care is all the medical care you receive before the baby's birth. This care will help prevent, find, and treat any problems during the pregnancy and childbirth. Body changes during your first trimester Your body goes through many changes during pregnancy. The changes vary from woman to woman.  You may gain or lose a couple of pounds at first.  You may feel sick to your stomach (nauseous) and you may throw up (vomit). If the vomiting is uncontrollable, call your health care provider.  You may tire easily.  You may develop headaches that can be relieved by medicines. All medicines should be approved by your health care provider.  You may urinate more often. Painful urination may mean you have a bladder infection.  You may develop heartburn as a result of your pregnancy.  You may develop constipation because certain hormones are causing the muscles that push stool through your intestines to slow down.  You may develop hemorrhoids or swollen veins (varicose veins).  Your breasts may begin to grow larger and become tender. Your nipples may stick out more, and the tissue that surrounds them (areola) may become darker.  Your gums may bleed and may be  sensitive to brushing and flossing.  Dark spots or blotches (chloasma, mask of pregnancy) may develop on your face. This will likely fade after the baby is born.  Your menstrual periods will stop.  You may have a loss of appetite.  You may develop cravings for certain kinds of food.  You may have changes in your emotions from day to day, such as being excited to be pregnant or being concerned that something may go wrong with the pregnancy and baby.  You may have more vivid and strange dreams.  You may have changes in your hair. These can include thickening of your hair, rapid growth, and changes in texture. Some women also have hair loss during or after pregnancy, or hair that feels dry or thin. Your hair will most likely return to normal after your baby is born. What to expect at prenatal visits During a routine prenatal visit:  You will be weighed to make sure you and the baby are growing normally.  Your blood pressure will be taken.  Your abdomen will be measured to track your baby's growth.  The fetal heartbeat will be listened to between weeks 10 and 14 of your pregnancy.  Test results from any previous visits will be discussed. Your health care provider may ask you:  How you are feeling.  If you are feeling the baby move.  If you have had any abnormal symptoms, such as leaking fluid, bleeding, severe headaches, or abdominal   cramping.  If you are using any tobacco products, including cigarettes, chewing tobacco, and electronic cigarettes.  If you have any questions. Other tests that may be performed during your first trimester include:  Blood tests to find your blood type and to check for the presence of any previous infections. The tests will also be used to check for low iron levels (anemia) and protein on red blood cells (Rh antibodies). Depending on your risk factors, or if you previously had diabetes during pregnancy, you may have tests to check for high blood sugar  that affects pregnant women (gestational diabetes).  Urine tests to check for infections, diabetes, or protein in the urine.  An ultrasound to confirm the proper growth and development of the baby.  Fetal screens for spinal cord problems (spina bifida) and Down syndrome.  HIV (human immunodeficiency virus) testing. Routine prenatal testing includes screening for HIV, unless you choose not to have this test.  You may need other tests to make sure you and the baby are doing well. Follow these instructions at home: Medicines  Follow your health care provider's instructions regarding medicine use. Specific medicines may be either safe or unsafe to take during pregnancy.  Take a prenatal vitamin that contains at least 600 micrograms (mcg) of folic acid.  If you develop constipation, try taking a stool softener if your health care provider approves. Eating and drinking   Eat a balanced diet that includes fresh fruits and vegetables, whole grains, good sources of protein such as meat, eggs, or tofu, and low-fat dairy. Your health care provider will help you determine the amount of weight gain that is right for you.  Avoid raw meat and uncooked cheese. These carry germs that can cause birth defects in the baby.  Eating four or five small meals rather than three large meals a day may help relieve nausea and vomiting. If you start to feel nauseous, eating a few soda crackers can be helpful. Drinking liquids between meals, instead of during meals, also seems to help ease nausea and vomiting.  Limit foods that are high in fat and processed sugars, such as fried and sweet foods.  To prevent constipation: ? Eat foods that are high in fiber, such as fresh fruits and vegetables, whole grains, and beans. ? Drink enough fluid to keep your urine clear or pale yellow. Activity  Exercise only as directed by your health care provider. Most women can continue their usual exercise routine during  pregnancy. Try to exercise for 30 minutes at least 5 days a week. Exercising will help you: ? Control your weight. ? Stay in shape. ? Be prepared for labor and delivery.  Experiencing pain or cramping in the lower abdomen or lower back is a good sign that you should stop exercising. Check with your health care provider before continuing with normal exercises.  Try to avoid standing for long periods of time. Move your legs often if you must stand in one place for a long time.  Avoid heavy lifting.  Wear low-heeled shoes and practice good posture.  You may continue to have sex unless your health care provider tells you not to. Relieving pain and discomfort  Wear a good support bra to relieve breast tenderness.  Take warm sitz baths to soothe any pain or discomfort caused by hemorrhoids. Use hemorrhoid cream if your health care provider approves.  Rest with your legs elevated if you have leg cramps or low back pain.  If you develop varicose veins in   your legs, wear support hose. Elevate your feet for 15 minutes, 3-4 times a day. Limit salt in your diet. Prenatal care  Schedule your prenatal visits by the twelfth week of pregnancy. They are usually scheduled monthly at first, then more often in the last 2 months before delivery.  Write down your questions. Take them to your prenatal visits.  Keep all your prenatal visits as told by your health care provider. This is important. Safety  Wear your seat belt at all times when driving.  Make a list of emergency phone numbers, including numbers for family, friends, the hospital, and police and fire departments. General instructions  Ask your health care provider for a referral to a local prenatal education class. Begin classes no later than the beginning of month 6 of your pregnancy.  Ask for help if you have counseling or nutritional needs during pregnancy. Your health care provider can offer advice or refer you to specialists for help  with various needs.  Do not use hot tubs, steam rooms, or saunas.  Do not douche or use tampons or scented sanitary pads.  Do not cross your legs for long periods of time.  Avoid cat litter boxes and soil used by cats. These carry germs that can cause birth defects in the baby and possibly loss of the fetus by miscarriage or stillbirth.  Avoid all smoking, herbs, alcohol, and medicines not prescribed by your health care provider. Chemicals in these products affect the formation and growth of the baby.  Do not use any products that contain nicotine or tobacco, such as cigarettes and e-cigarettes. If you need help quitting, ask your health care provider. You may receive counseling support and other resources to help you quit.  Schedule a dentist appointment. At home, brush your teeth with a soft toothbrush and be gentle when you floss. Contact a health care provider if:  You have dizziness.  You have mild pelvic cramps, pelvic pressure, or nagging pain in the abdominal area.  You have persistent nausea, vomiting, or diarrhea.  You have a bad smelling vaginal discharge.  You have pain when you urinate.  You notice increased swelling in your face, hands, legs, or ankles.  You are exposed to fifth disease or chickenpox.  You are exposed to German measles (rubella) and have never had it. Get help right away if:  You have a fever.  You are leaking fluid from your vagina.  You have spotting or bleeding from your vagina.  You have severe abdominal cramping or pain.  You have rapid weight gain or loss.  You vomit blood or material that looks like coffee grounds.  You develop a severe headache.  You have shortness of breath.  You have any kind of trauma, such as from a fall or a car accident. Summary  The first trimester of pregnancy is from week 1 until the end of week 13 (months 1 through 3).  Your body goes through many changes during pregnancy. The changes vary from  woman to woman.  You will have routine prenatal visits. During those visits, your health care provider will examine you, discuss any test results you may have, and talk with you about how you are feeling. This information is not intended to replace advice given to you by your health care provider. Make sure you discuss any questions you have with your health care provider. Document Released: 06/19/2001 Document Revised: 06/07/2017 Document Reviewed: 06/06/2016 Elsevier Patient Education  2020 [employer]  

## 2021-12-06 ENCOUNTER — Other Ambulatory Visit: Payer: Self-pay | Admitting: Obstetrics and Gynecology

## 2021-12-06 DIAGNOSIS — E079 Disorder of thyroid, unspecified: Secondary | ICD-10-CM

## 2021-12-07 ENCOUNTER — Ambulatory Visit
Admission: RE | Admit: 2021-12-07 | Discharge: 2021-12-07 | Disposition: A | Discharge status: Home / Self Care | Payer: No Typology Code available for payment source | Source: Ambulatory Visit | Attending: Obstetrics and Gynecology | Admitting: Obstetrics and Gynecology

## 2021-12-07 DIAGNOSIS — E079 Disorder of thyroid, unspecified: Secondary | ICD-10-CM

## 2023-02-25 ENCOUNTER — Encounter: Payer: Self-pay | Admitting: Adult Health

## 2024-03-21 IMAGING — US US THYROID
1 series · 14 of 25 positions shown · non-contrast
Comparison: None Available.

CLINICAL DATA: Other. Abnormal parathyroid hormone. Fatigue.
Hoarseness of voice.

EXAM:
THYROID ULTRASOUND
TECHNIQUE: Ultrasound examination of the thyroid gland and adjacent soft
tissues was performed.

[Series 1: us thyroid · 0.04mm/px · 14 of 46 slices shown]
[im 1/46]
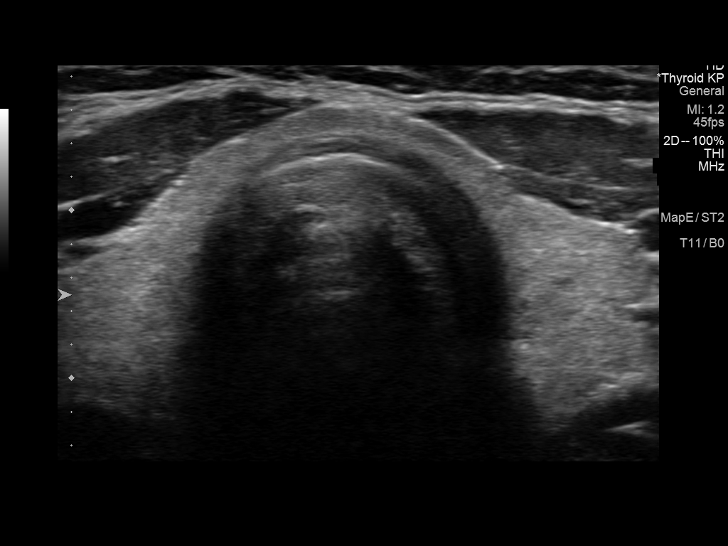
[im 4/46]
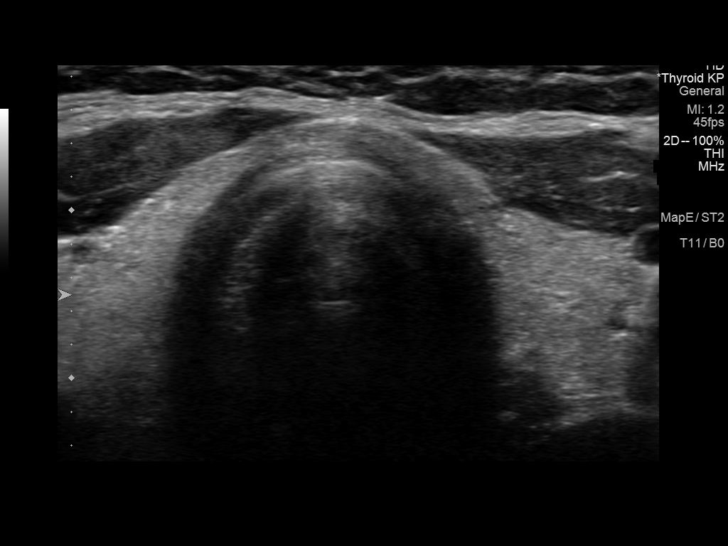
[im 8/46]
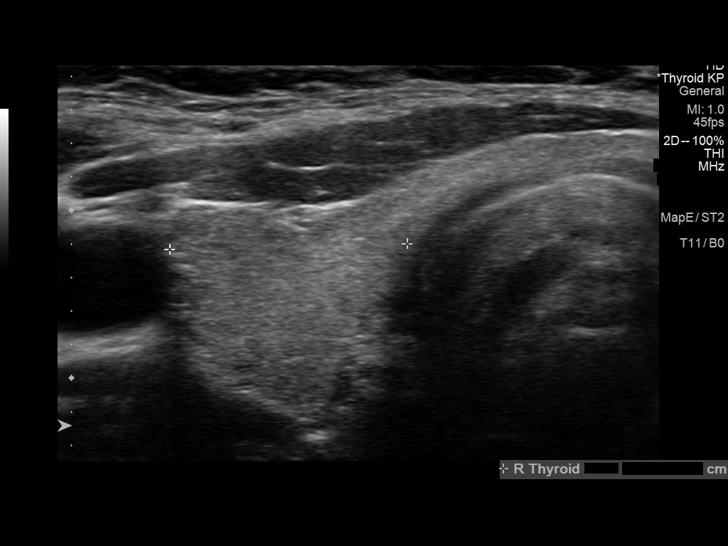
[im 12/46]
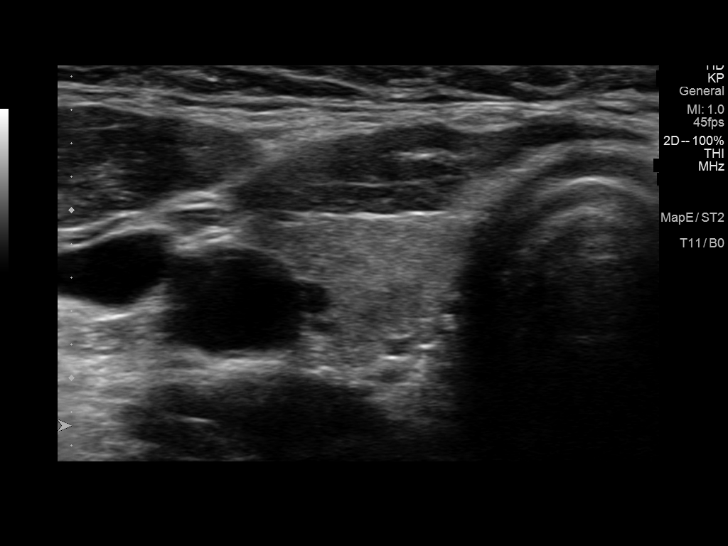
[im 16/46]
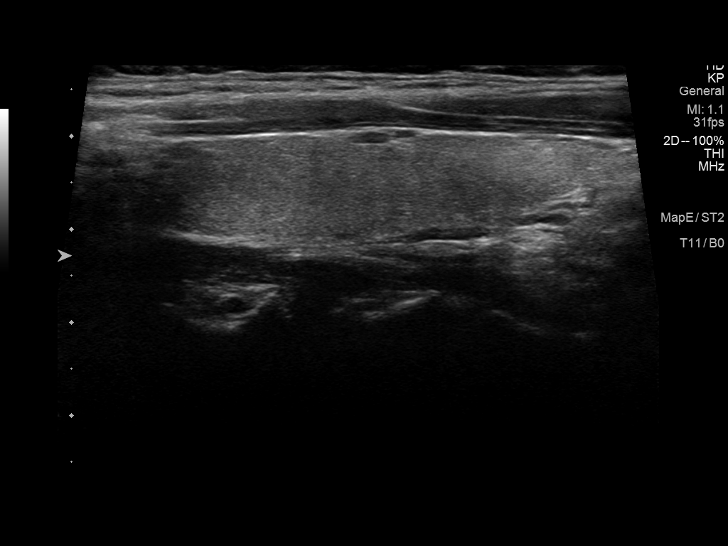
[im 17/46]
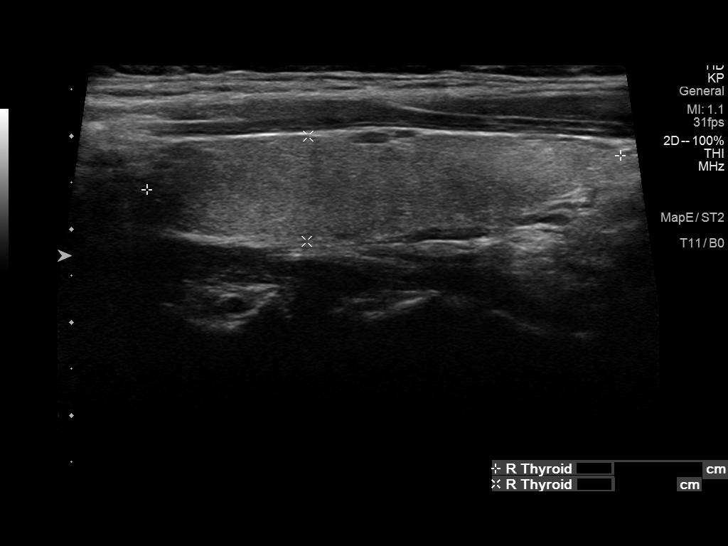
[im 21/46]
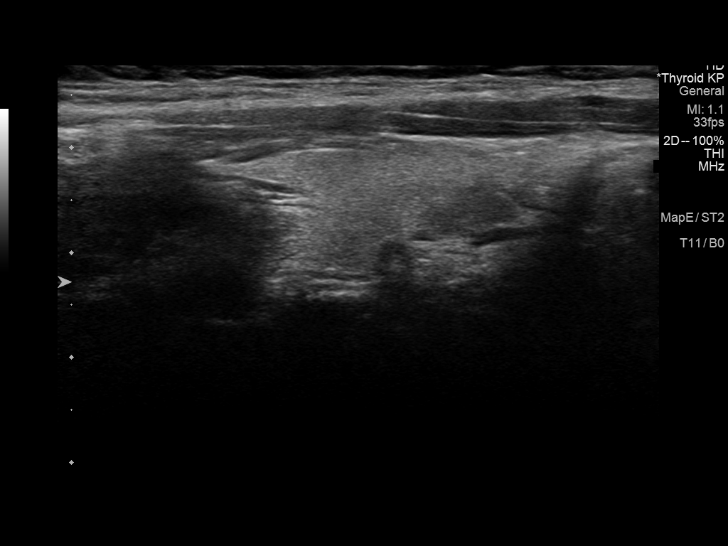
[im 25/46]
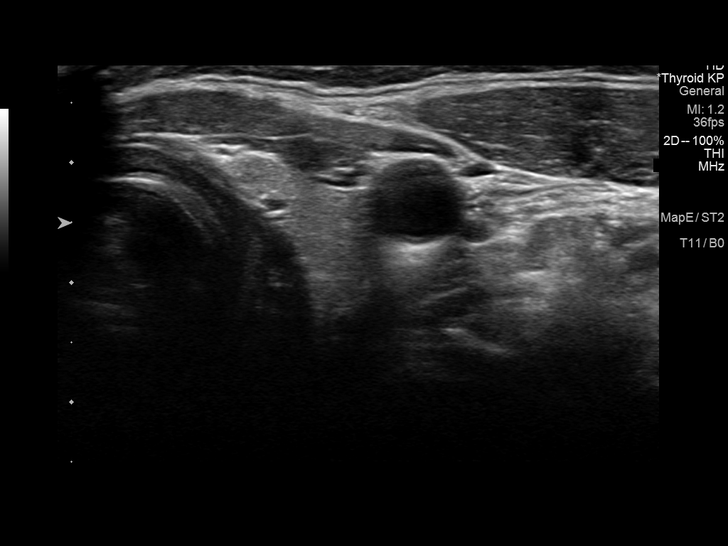
[im 29/46]
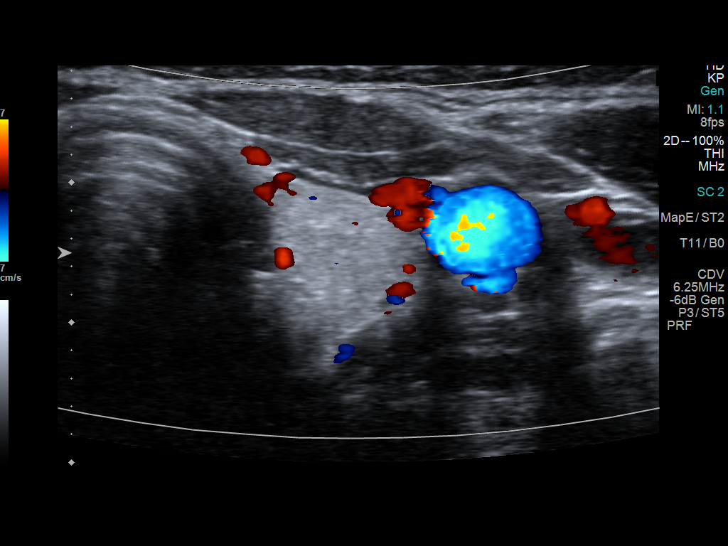
[im 31/46]
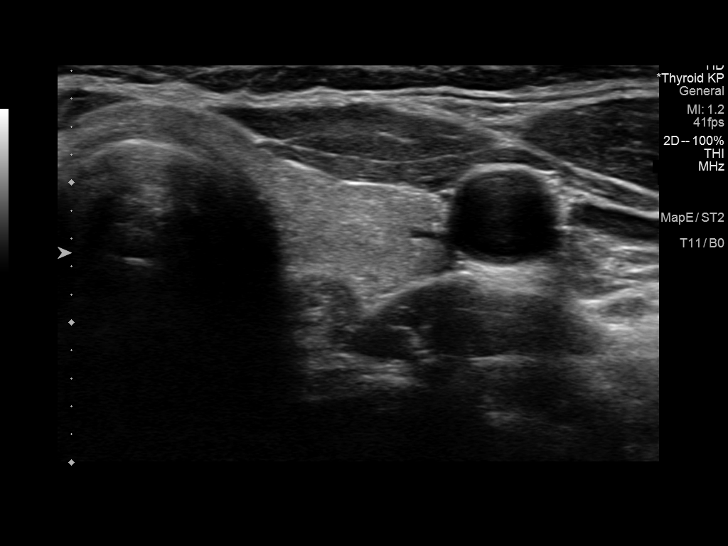
[im 34/46]
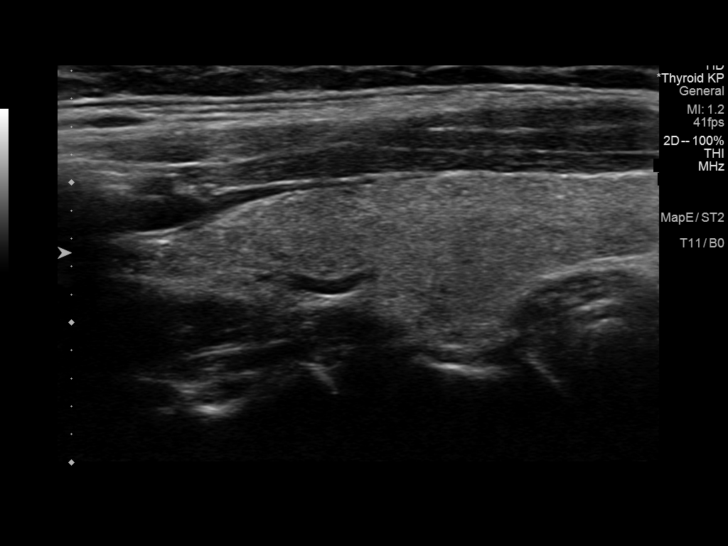
[im 38/46]
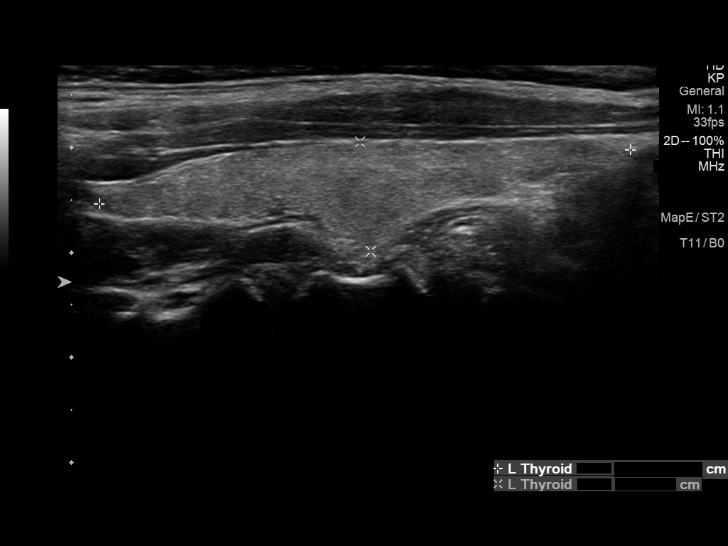
[im 42/46]
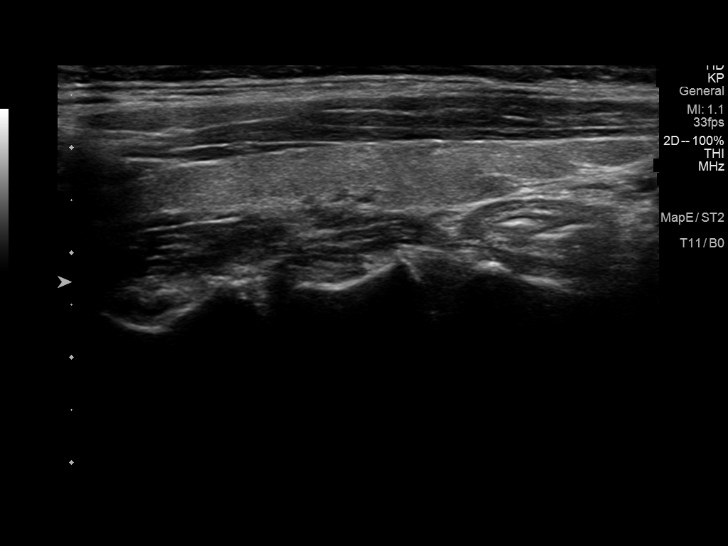
[im 46/46]
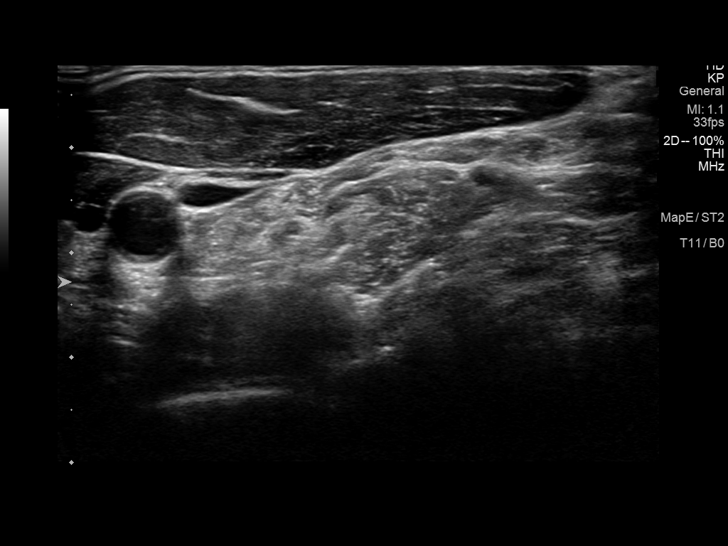

[14 of 25 positions shown; findings below may reference images not displayed]

FINDINGS: Parenchymal Echotexture: Normal

Isthmus: Normal in size measuring 0.2 cm in diameter

Right lobe: Normal in size measuring 5.1 x 1.4 x 1.1 cm

Left lobe: Normal in size measuring 5.1 x 1.1 x 1.1 cm

_________________________________________________________

Estimated total number of nodules >/= 1 cm: 0

Number of spongiform nodules >/=  2 cm not described below (TR1): 0

Number of mixed cystic and solid nodules >/= 1.5 cm not described
below (TR2): 0

_________________________________________________________

No discrete nodules are seen within the thyroid gland.

No additional discrete extra thyroidal nodules are identified to
suggest the presence of a parathyroid adenoma.
IMPRESSION: Normal sized and appearing thyroid without discrete nodule or mass.
